# Patient Record
Sex: Female | Born: 1965 | Race: White | Hispanic: No | Smoking: Never smoker
Health system: Southern US, Academic
[De-identification: ages and names within clinical notes are randomized; demographics above are authoritative.]

## PROBLEM LIST (undated history)

## (undated) DIAGNOSIS — D649 Anemia, unspecified: Secondary | ICD-10-CM

## (undated) DIAGNOSIS — R7303 Prediabetes: Secondary | ICD-10-CM

## (undated) DIAGNOSIS — E119 Type 2 diabetes mellitus without complications: Secondary | ICD-10-CM

## (undated) DIAGNOSIS — J45909 Unspecified asthma, uncomplicated: Secondary | ICD-10-CM

## (undated) HISTORY — DX: Unspecified asthma, uncomplicated: J45.909

## (undated) HISTORY — PX: LYMPH NODE BIOPSY: SHX201

## (undated) HISTORY — PX: THIGH LIFT: SHX2496

## (undated) HISTORY — PX: HX HERNIA REPAIR: SHX51

## (undated) HISTORY — DX: Prediabetes: R73.03

## (undated) HISTORY — PX: CYSTECTOMY: SUR359

## (undated) HISTORY — PX: HX COLONOSCOPY: 2100001147

## (undated) HISTORY — DX: Anemia, unspecified: D64.9

## (undated) HISTORY — PX: HX GALL BLADDER SURGERY/CHOLE: SHX55

## (undated) HISTORY — DX: Type 2 diabetes mellitus without complications: E11.9

## (undated) HISTORY — PX: HX TONSILLECTOMY: SHX27

## (undated) HISTORY — PX: GASTRIC BYPASS OPEN: SUR638

## (undated) HISTORY — PX: HX HIP REPLACEMENT: SHX124

## (undated) HISTORY — PX: HX CARPAL TUNNEL RELEASE: SHX101

## (undated) HISTORY — PX: HX BREAST REDUCTION: SHX8

---

## 1995-04-29 ENCOUNTER — Other Ambulatory Visit (HOSPITAL_COMMUNITY): Payer: Self-pay

## 2003-09-02 ENCOUNTER — Ambulatory Visit (HOSPITAL_COMMUNITY): Payer: Self-pay | Admitting: PLASTIC AND RECONSTRUCTIVE SURGERY

## 2021-10-07 ENCOUNTER — Encounter (INDEPENDENT_AMBULATORY_CARE_PROVIDER_SITE_OTHER): Payer: Self-pay

## 2021-10-07 ENCOUNTER — Ambulatory Visit (INDEPENDENT_AMBULATORY_CARE_PROVIDER_SITE_OTHER): Payer: 59 | Admitting: NURSE PRACTITIONER

## 2021-10-07 ENCOUNTER — Encounter (INDEPENDENT_AMBULATORY_CARE_PROVIDER_SITE_OTHER): Payer: Self-pay | Admitting: NURSE PRACTITIONER

## 2021-10-07 ENCOUNTER — Other Ambulatory Visit: Payer: Self-pay

## 2021-10-07 VITALS — BP 141/55 | HR 90 | Temp 97.2°F | Ht 62.0 in | Wt 300.4 lb

## 2021-10-07 DIAGNOSIS — D508 Other iron deficiency anemias: Secondary | ICD-10-CM

## 2021-10-07 DIAGNOSIS — D509 Iron deficiency anemia, unspecified: Secondary | ICD-10-CM

## 2021-10-07 NOTE — H&P (Signed)
Department of Hematology/Oncology  History and Physical    Name: Stephanie Blackburn  WCH:[id number]  Date of Birth: 10-18-1965  Encounter Date: 10/07/2021    REFERRING PROVIDER:  Rowland Lathe D, DO  [address]  [place],  [place] [address]    REASON FOR OFFICE VISIT:  A patient previously sent [facility] for evaluation and management of  iron-deficiency anemia.    HISTORY OF PRESENT ILLNESS:  Stephanie Blackburn is a 56 y.o. female who presents today for iron-deficiency anemia.  She had labs completed by her PCP on 07/14/2021 which showed iron-deficiency.  She was also diagnosed with B12 deficiency and she is been on B12 injections once weekly.  She had Imferon IV but had an allergic reaction to this.  She was seen given Venofer and tolerated this well.  Her last Venofer treatments were 04/2016.  She states that she does crave salt, and has increased fatigue. The patient offers no other complaints at this time.  The patient has had a good appetite and stable weight.  The patient has not noted any new areas of disease on own self exam.  The patient has not had any chest pain or dyspnea.  The patient has not had any headaches or changes in vision.  The patient has not had any abdominal pain, nausea, or vomiting.  There have been no changes in bowel or bladder habits.  The patient has not had any abnormal bleeding or clotting episodes.  There have been no complaints of fever, chills, cough, sputum production, dysuria, or diarrhea.    Assessment/Plan:    1) Iron deficiency anemia, probably secondary to poor absorption, responsive to Venofer.  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   3) Allergy to Imferon  4) B12 deficiency    ROS:   Review of Systems   Constitutional: Positive for fatigue. Negative for appetite change.   HENT:  Negative.    Eyes: Negative.    Respiratory: Negative.  Negative for shortness of breath.    Cardiovascular: Negative.  Negative for chest pain.    Gastrointestinal: Negative.  Negative for abdominal pain, diarrhea, nausea and vomiting.   Genitourinary: Negative.  Negative for difficulty urinating.    Musculoskeletal: Negative.    Skin: Negative.    Neurological: Negative.    Hematological: Negative.    Psychiatric/Behavioral: Negative.         History:  Past Medical History:   Diagnosis Date   . Anemia    . Asthma    . Diabetes mellitus, type 2 (CMS HCC)            Past Surgical History:   Procedure Laterality Date   . CYSTECTOMY      on uterius   . GASTRIC BYPASS OPEN     . HX BREAST REDUCTION     . HX CARPAL TUNNEL RELEASE Bilateral    . HX CHOLECYSTECTOMY     . HX COLONOSCOPY     . HX HERNIA REPAIR     . HX TONSILLECTOMY     . LYMPH NODE BIOPSY     . THIGH LIFT             Social History     Socioeconomic History   . Marital status: Widowed     Spouse name: Not on file   . Number of children: Not on file   . Years of education: Not on file   . Highest education  level: Not on file   Occupational History   . Not on file   Tobacco Use   . Smoking status: Never   . Smokeless tobacco: Never   Vaping Use   . Vaping Use: Never used   Substance and Sexual Activity   . Alcohol use: Never   . Drug use: Never   . Sexual activity: Not on file   Other Topics Concern   . Not on file   Social History Narrative   . Not on file     Social Determinants of Health     Financial Resource Strain: Not on file   Transportation Needs: Not on file   Social Connections: Not on file   Intimate Partner Violence: Not on file   Housing Stability: Not on file       Social History     Social History Narrative   . Not on file       Social History     Substance and Sexual Activity   Drug Use Never       Family Medical History:     Problem Relation (Age of Onset)    No Known Problems Mother, Father            Current Outpatient Medications   Medication Sig   . albuterol sulfate (PROVENTIL OR VENTOLIN OR PROAIR) 90 mcg/actuation Inhalation oral inhaler INHALE 2 PUFFS BY MOUTH 4 TIMES DAILY    . ascorbic acid, vitamin C, (VITAMIN C) 500 mg Oral Tablet Take 1 Tablet (500 mg total) by mouth Once a day   . BD LUER-LOK SYRINGE 3 mL 25 gauge x 1" Syringe USE 1 SYRINGE ONCE A WEEK   . benzonatate (TESSALON) 100 mg Oral Capsule TAKE 1 CAPSULE BY MOUTH THREE TIMES DAILY AS NEEDED   . cyanocobalamin (VITAMIN B12) 1,000 mcg/mL Injection Solution INJECT 1 ML (CC) INTRAMUSCULARLY ONCE A WEEK   . ergocalciferol, vitamin D2, (DRISDOL) 1,250 mcg (50,000 unit) Oral Capsule TAKE 2 CAPSULES A WEEK WITH FOOD AS DIRECTED   . fluticasone propionate (FLONASE) 50 mcg/actuation Nasal Spray, Suspension Administer 2 Sprays into each nostril Once a day   . loratadine (CLARITIN) 10 mg Oral Tablet Take 1 Tablet (10 mg total) by mouth Once a day   . meloxicam (MOBIC) 7.5 mg Oral Tablet Take 1 Tablet (7.5 mg total) by mouth Twice daily   . metFORMIN (GLUCOPHAGE XR) 500 mg Oral Tablet Sustained Release 24 hr Take 1 Tablet (500 mg total) by mouth Once a day   . montelukast (SINGULAIR) 10 mg Oral Tablet Take 1 Tablet (10 mg total) by mouth Once a day   . oxyBUTYnin chloride (DITROPAN XL) 10 mg Oral Tablet Extended Rel 24 hr Take 1 Tablet (10 mg total) by mouth Once a day   . sertraline (ZOLOFT) 100 mg Oral Tablet Take 1 Tablet (100 mg total) by mouth Once a day   . traMADoL (ULTRAM) 50 mg Oral Tablet Take 1 Tablet (50 mg total) by mouth Three times a day as needed   . zinc Sulfate (ZINCATE) 50 mg zinc (220 mg) Oral Tablet Take 1 Tablet (50 mg total) by mouth Three times a day       Allergies   Allergen Reactions   . Ak-Mycin [Erythromycin]    . Penicillins    . Phenergan [Promethazine]          PHYSICAL EXAM:  Most Recent Vitals    Flowsheet Row Office Visit from 10/07/2021 in Hematology/Oncology,  [facility]   Temperature 36.2 C (97.2 F) filed at... 10/07/2021 0948   Heart Rate 90 filed at... 10/07/2021 0948   Respiratory Rate --   BP (Non-Invasive) 141/55 filed at... 10/07/2021 0948   SpO2 --   Height 1.575 m (5'  2") filed at... 10/07/2021 0948   Weight 136 kg (300 lb 6.4 oz) filed at... 10/07/2021 0948   BMI (Calculated) 55.06 filed at... 10/07/2021 0948   BSA (Calculated) 2.44 filed at... 10/07/2021 0948      ECOG Status: (0) Fully active, able to carry on all predisease performance without restriction   Physical Exam  Vitals and nursing note reviewed.   Constitutional:       Appearance: Normal appearance.   HENT:      Head: Normocephalic.      Nose: Nose normal.      Mouth/Throat:      Mouth: Mucous membranes are moist.      Pharynx: Oropharynx is clear.   Eyes:      General: No scleral icterus.     Extraocular Movements: Extraocular movements intact.   Cardiovascular:      Rate and Rhythm: Normal rate and regular rhythm.      Pulses: Normal pulses.      Heart sounds: Normal heart sounds.   Pulmonary:      Effort: Pulmonary effort is normal.      Breath sounds: Normal breath sounds.   Abdominal:      General: Bowel sounds are normal.      Palpations: Abdomen is soft.   Musculoskeletal:         General: Normal range of motion.      Cervical back: Normal range of motion and neck supple.   Skin:     General: Skin is warm and dry.   Neurological:      General: No focal deficit present.      Mental Status: She is alert and oriented to person, place, and time. Mental status is at baseline.   Psychiatric:         Mood and Affect: Mood normal.         Behavior: Behavior normal.         Thought Content: Thought content normal.         Judgment: Judgment normal.         LABS:   Labs completed on 05/07/2019: WBC 12.6, HGB 14.3, HCT 46.3, PLT 229,000.    ASSESSMENT:    ICD-10-CM    1. Iron deficiency anemia  D50.9              PLAN:   1. All relative external and internal medical records were reviewed including available H&Ps, progress notes, procedure notes, imaging's, laboratories, and pathology.   2. All labs from last visit were reviewed with the patient. Details of exam finding's discussed.       KARISHA MARLIN was given the  chance to ask questions, and these were answered to their satisfaction. The patient is welcome to call with any questions or concerns in the meantime.     On the day of the encounter, a total of  52 minutes was spent on this patient encounter including review of historical information, examination, documentation and post-visit activities.   Return in about 12 weeks (around 12/30/2021).     Benjaman Pott, APRN, FNP-BC    CC:  Manuella Ghazi, DO  [address]  [place] [address]    Glasscock,  Kirke Corin, DO  [address]  [place],  [place] [address]      This note was partially generated using MModal Fluency Direct system, and there may be some incorrect words, spellings, and punctuation that were not noted in checking the note before saving.

## 2021-10-19 ENCOUNTER — Other Ambulatory Visit (HOSPITAL_COMMUNITY): Payer: Self-pay | Admitting: NURSE PRACTITIONER

## 2021-10-19 DIAGNOSIS — D509 Iron deficiency anemia, unspecified: Secondary | ICD-10-CM

## 2021-10-19 NOTE — Addendum Note (Signed)
Addended by: Benjaman Pott ANN on: 10/19/2021 12:12 PM     Modules accepted: Orders

## 2021-10-25 ENCOUNTER — Other Ambulatory Visit: Payer: Self-pay

## 2021-10-29 ENCOUNTER — Inpatient Hospital Stay (HOSPITAL_COMMUNITY)
Admission: RE | Admit: 2021-10-29 | Discharge: 2021-10-29 | Disposition: A | Discharge status: Still In Hospital | Payer: 59 | Source: Ambulatory Visit | Attending: NURSE PRACTITIONER | Admitting: NURSE PRACTITIONER

## 2021-10-29 ENCOUNTER — Other Ambulatory Visit: Payer: Self-pay

## 2021-10-29 VITALS — BP 134/70 | HR 84 | Temp 97.9°F | Resp 20

## 2021-10-29 DIAGNOSIS — D508 Other iron deficiency anemias: Secondary | ICD-10-CM

## 2021-10-29 DIAGNOSIS — D509 Iron deficiency anemia, unspecified: Secondary | ICD-10-CM

## 2021-10-29 MED ORDER — SODIUM CHLORIDE 0.9 % IV BOLUS
500.0000 mL | INJECTION | Freq: Once | Status: DC | PRN
Start: 2021-10-29 — End: 2021-10-30

## 2021-10-29 MED ORDER — SODIUM CHLORIDE 0.9 % INTRAVENOUS SOLUTION
200.0000 mg | Freq: Once | INTRAVENOUS | Status: AC
Start: 2021-10-29 — End: 2021-10-29
  Administered 2021-10-29: 0 mg via INTRAVENOUS
  Administered 2021-10-29: 200 mg via INTRAVENOUS
  Filled 2021-10-29: qty 10

## 2021-10-29 MED ORDER — ACETAMINOPHEN 325 MG TABLET
975.0000 mg | ORAL_TABLET | Freq: Once | ORAL | Status: DC | PRN
Start: 2021-10-29 — End: 2021-10-30

## 2021-10-29 MED ORDER — HYDROCORTISONE SOD SUCCINATE 100 MG/2 ML VIAL WRAPPER
100.0000 mg | Freq: Once | INTRAMUSCULAR | Status: DC | PRN
Start: 2021-10-29 — End: 2021-10-30

## 2021-10-29 MED ORDER — LORATADINE 10 MG TABLET
10.0000 mg | ORAL_TABLET | Freq: Once | ORAL | Status: DC | PRN
Start: 2021-10-29 — End: 2021-10-30

## 2021-10-29 MED ORDER — ALBUTEROL SULFATE HFA 90 MCG/ACTUATION AEROSOL INHALER - RN
2.0000 | Freq: Once | RESPIRATORY_TRACT | Status: DC | PRN
Start: 2021-10-29 — End: 2021-10-30

## 2021-10-29 MED ORDER — ALBUTEROL SULFATE 2.5 MG/3 ML (0.083 %) SOLUTION FOR NEBULIZATION
2.5000 mg | INHALATION_SOLUTION | Freq: Once | RESPIRATORY_TRACT | Status: DC | PRN
Start: 2021-10-29 — End: 2021-10-30

## 2021-10-29 MED ORDER — EPINEPHRINE HCL (PF) 1 MG/ML (1 ML) INJECTION SOLUTION
0.3000 mg | Freq: Once | INTRAMUSCULAR | Status: DC | PRN
Start: 2021-10-29 — End: 2021-10-30

## 2021-10-29 MED ORDER — FAMOTIDINE (PF) 20 MG/2 ML INTRAVENOUS SOLUTION
20.0000 mg | Freq: Once | INTRAVENOUS | Status: DC | PRN
Start: 2021-10-29 — End: 2021-10-30

## 2021-10-29 NOTE — Nurses Notes (Signed)
Pt arrived to floor for iron infusion.she is alert an oriented with no complaints voiced.assessment WNL.Pt received iron with no reactions noted.Down via walking when DC,d no concerns voiced upon leaving.Rosaura Carpenter, LPN

## 2021-10-30 ENCOUNTER — Inpatient Hospital Stay
Admission: RE | Admit: 2021-10-30 | Discharge: 2021-10-30 | Disposition: A | Discharge status: Still In Hospital | Payer: 59 | Source: Ambulatory Visit | Attending: NURSE PRACTITIONER | Admitting: NURSE PRACTITIONER

## 2021-10-30 VITALS — BP 143/62 | HR 84 | Temp 97.8°F | Resp 20

## 2021-10-30 DIAGNOSIS — D508 Other iron deficiency anemias: Secondary | ICD-10-CM

## 2021-10-30 MED ORDER — FAMOTIDINE (PF) 20 MG/2 ML INTRAVENOUS SOLUTION
20.0000 mg | Freq: Once | INTRAVENOUS | Status: DC | PRN
Start: 2021-10-30 — End: 2021-10-31

## 2021-10-30 MED ORDER — ACETAMINOPHEN 325 MG TABLET
975.0000 mg | ORAL_TABLET | Freq: Once | ORAL | Status: DC | PRN
Start: 2021-10-30 — End: 2021-10-31

## 2021-10-30 MED ORDER — SODIUM CHLORIDE 0.9 % INTRAVENOUS SOLUTION
200.0000 mg | Freq: Once | INTRAVENOUS | Status: AC
Start: 2021-10-30 — End: 2021-10-30
  Administered 2021-10-30: 200 mg via INTRAVENOUS
  Administered 2021-10-30: 0 mg via INTRAVENOUS
  Filled 2021-10-30: qty 10

## 2021-10-30 MED ORDER — ALBUTEROL SULFATE 2.5 MG/3 ML (0.083 %) SOLUTION FOR NEBULIZATION
2.5000 mg | INHALATION_SOLUTION | Freq: Once | RESPIRATORY_TRACT | Status: DC | PRN
Start: 2021-10-30 — End: 2021-10-31

## 2021-10-30 MED ORDER — HYDROCORTISONE SOD SUCCINATE 100 MG/2 ML VIAL WRAPPER
100.0000 mg | Freq: Once | INTRAMUSCULAR | Status: DC | PRN
Start: 2021-10-30 — End: 2021-10-31

## 2021-10-30 MED ORDER — EPINEPHRINE HCL (PF) 1 MG/ML (1 ML) INJECTION SOLUTION
0.3000 mg | Freq: Once | INTRAMUSCULAR | Status: DC | PRN
Start: 2021-10-30 — End: 2021-10-31

## 2021-10-30 MED ORDER — SODIUM CHLORIDE 0.9 % IV BOLUS
500.0000 mL | INJECTION | Freq: Once | Status: DC | PRN
Start: 2021-10-30 — End: 2021-10-31

## 2021-10-30 MED ORDER — LORATADINE 10 MG TABLET
10.0000 mg | ORAL_TABLET | Freq: Once | ORAL | Status: DC | PRN
Start: 2021-10-30 — End: 2021-10-31

## 2021-10-30 MED ORDER — ALBUTEROL SULFATE HFA 90 MCG/ACTUATION AEROSOL INHALER - RN
2.0000 | Freq: Once | RESPIRATORY_TRACT | Status: DC | PRN
Start: 2021-10-30 — End: 2021-10-31

## 2021-10-30 NOTE — Nurses Notes (Signed)
Pt arrived to floor for iron infusion.She is alert an oriented with no complaints voiced,assessment WNL.iv patent.infusion complete no reactions or distress noted during or after treatment.down via walking when DC,d.Rosaura Carpenter, LPN

## 2021-11-05 ENCOUNTER — Ambulatory Visit (HOSPITAL_COMMUNITY): Payer: Self-pay

## 2021-11-19 ENCOUNTER — Ambulatory Visit (HOSPITAL_COMMUNITY): Payer: Self-pay | Admitting: NURSE PRACTITIONER

## 2021-12-02 ENCOUNTER — Other Ambulatory Visit: Payer: 59 | Attending: FAMILY PRACTICE | Admitting: FAMILY PRACTICE

## 2021-12-02 DIAGNOSIS — N390 Urinary tract infection, site not specified: Secondary | ICD-10-CM | POA: Insufficient documentation

## 2021-12-02 DIAGNOSIS — Z79899 Other long term (current) drug therapy: Secondary | ICD-10-CM | POA: Insufficient documentation

## 2021-12-02 LAB — URINE DRUG SCREEN
AMPHET QL: NEGATIVE
BARB QL: NEGATIVE
BENZO QL: NEGATIVE
BUP QL: NEGATIVE
CANNAQL: NEGATIVE
COCQL: NEGATIVE
METHQL: NEGATIVE
OPIATE: NEGATIVE
OXYCODONE URINE: NEGATIVE
PCP QL: NEGATIVE
TRICYCLIC ANTIDEPRESSANTS URINE: NEGATIVE

## 2021-12-04 LAB — URINE CULTURE: URINE CULTURE: 3000 — AB

## 2021-12-10 ENCOUNTER — Other Ambulatory Visit (HOSPITAL_COMMUNITY): Payer: Self-pay | Admitting: NURSE PRACTITIONER

## 2021-12-10 DIAGNOSIS — D509 Iron deficiency anemia, unspecified: Secondary | ICD-10-CM

## 2021-12-17 ENCOUNTER — Inpatient Hospital Stay (HOSPITAL_COMMUNITY): Payer: 59

## 2021-12-22 ENCOUNTER — Other Ambulatory Visit: Payer: Self-pay

## 2021-12-22 ENCOUNTER — Inpatient Hospital Stay (HOSPITAL_COMMUNITY)
Admission: RE | Admit: 2021-12-22 | Discharge: 2021-12-22 | Disposition: A | Discharge status: Still In Hospital | Payer: 59 | Source: Ambulatory Visit | Attending: NURSE PRACTITIONER | Admitting: NURSE PRACTITIONER

## 2021-12-22 VITALS — BP 155/85 | HR 76 | Temp 98.0°F | Resp 20

## 2021-12-22 DIAGNOSIS — D508 Other iron deficiency anemias: Secondary | ICD-10-CM | POA: Insufficient documentation

## 2021-12-22 MED ORDER — ACETAMINOPHEN 325 MG TABLET
975.0000 mg | ORAL_TABLET | Freq: Once | ORAL | Status: DC | PRN
Start: 2021-12-22 — End: 2021-12-23

## 2021-12-22 MED ORDER — EPINEPHRINE HCL (PF) 1 MG/ML (1 ML) INJECTION SOLUTION
0.3000 mg | Freq: Once | INTRAMUSCULAR | Status: DC | PRN
Start: 2021-12-22 — End: 2021-12-23

## 2021-12-22 MED ORDER — ALBUTEROL SULFATE HFA 90 MCG/ACTUATION AEROSOL INHALER - RN
2.0000 | Freq: Once | RESPIRATORY_TRACT | Status: DC | PRN
Start: 2021-12-22 — End: 2021-12-23

## 2021-12-22 MED ORDER — LORATADINE 10 MG TABLET
10.0000 mg | ORAL_TABLET | Freq: Once | ORAL | Status: DC | PRN
Start: 2021-12-22 — End: 2021-12-23

## 2021-12-22 MED ORDER — HYDROCORTISONE SOD SUCCINATE 100 MG/2 ML VIAL WRAPPER
100.0000 mg | Freq: Once | INTRAMUSCULAR | Status: DC | PRN
Start: 2021-12-22 — End: 2021-12-23

## 2021-12-22 MED ORDER — SODIUM CHLORIDE 0.9 % IV BOLUS
500.0000 mL | INJECTION | Freq: Once | Status: DC | PRN
Start: 2021-12-22 — End: 2021-12-23

## 2021-12-22 MED ORDER — SODIUM CHLORIDE 0.9 % INTRAVENOUS SOLUTION
200.0000 mg | Freq: Once | INTRAVENOUS | Status: AC
Start: 2021-12-22 — End: 2021-12-22
  Administered 2021-12-22: 200 mg via INTRAVENOUS
  Administered 2021-12-22: 0 mg via INTRAVENOUS
  Filled 2021-12-22: qty 10

## 2021-12-22 MED ORDER — ALBUTEROL SULFATE 2.5 MG/3 ML (0.083 %) SOLUTION FOR NEBULIZATION
2.5000 mg | INHALATION_SOLUTION | Freq: Once | RESPIRATORY_TRACT | Status: DC | PRN
Start: 2021-12-22 — End: 2021-12-23

## 2021-12-22 MED ORDER — FAMOTIDINE (PF) 20 MG/2 ML INTRAVENOUS SOLUTION
20.0000 mg | Freq: Once | INTRAVENOUS | Status: DC | PRN
Start: 2021-12-22 — End: 2021-12-23

## 2021-12-22 NOTE — Nurses Notes (Signed)
Summary: pt arrived to floor for iron infusion.she is alert an oriented with no complaints voiced.assessment WNL.iron received with no distress or reactions noted..Down via walking when DC,d no concerns voiced upon leaving.Avriel Kandel, LPN

## 2021-12-23 ENCOUNTER — Ambulatory Visit (INDEPENDENT_AMBULATORY_CARE_PROVIDER_SITE_OTHER): Payer: Self-pay | Admitting: NURSE PRACTITIONER

## 2021-12-23 ENCOUNTER — Ambulatory Visit (HOSPITAL_COMMUNITY): Payer: Self-pay | Admitting: NURSE PRACTITIONER

## 2021-12-24 ENCOUNTER — Inpatient Hospital Stay (HOSPITAL_COMMUNITY)
Admission: RE | Admit: 2021-12-24 | Discharge: 2021-12-24 | Disposition: A | Discharge status: Still In Hospital | Payer: 59 | Source: Ambulatory Visit | Attending: NURSE PRACTITIONER | Admitting: NURSE PRACTITIONER

## 2021-12-24 ENCOUNTER — Other Ambulatory Visit: Payer: Self-pay

## 2021-12-24 VITALS — BP 132/78 | HR 79 | Temp 97.4°F | Resp 20

## 2021-12-24 DIAGNOSIS — D508 Other iron deficiency anemias: Secondary | ICD-10-CM

## 2021-12-24 MED ORDER — ALBUTEROL SULFATE HFA 90 MCG/ACTUATION AEROSOL INHALER - RN
2.0000 | Freq: Once | RESPIRATORY_TRACT | Status: DC | PRN
Start: 2021-12-24 — End: 2021-12-25

## 2021-12-24 MED ORDER — SODIUM CHLORIDE 0.9 % INTRAVENOUS SOLUTION
200.0000 mg | Freq: Once | INTRAVENOUS | Status: AC
Start: 2021-12-24 — End: 2021-12-24
  Administered 2021-12-24: 200 mg via INTRAVENOUS
  Administered 2021-12-24: 0 mg via INTRAVENOUS
  Filled 2021-12-24: qty 10

## 2021-12-24 MED ORDER — EPINEPHRINE HCL (PF) 1 MG/ML (1 ML) INJECTION SOLUTION
0.3000 mg | Freq: Once | INTRAMUSCULAR | Status: DC | PRN
Start: 2021-12-24 — End: 2021-12-25

## 2021-12-24 MED ORDER — LORATADINE 10 MG TABLET
10.0000 mg | ORAL_TABLET | Freq: Once | ORAL | Status: DC | PRN
Start: 2021-12-24 — End: 2021-12-25

## 2021-12-24 MED ORDER — ALBUTEROL SULFATE 2.5 MG/3 ML (0.083 %) SOLUTION FOR NEBULIZATION
2.5000 mg | INHALATION_SOLUTION | Freq: Once | RESPIRATORY_TRACT | Status: DC | PRN
Start: 2021-12-24 — End: 2021-12-25

## 2021-12-24 MED ORDER — FAMOTIDINE (PF) 20 MG/2 ML INTRAVENOUS SOLUTION
20.0000 mg | Freq: Once | INTRAVENOUS | Status: DC | PRN
Start: 2021-12-24 — End: 2021-12-25

## 2021-12-24 MED ORDER — HYDROCORTISONE SOD SUCCINATE 100 MG/2 ML VIAL WRAPPER
100.0000 mg | Freq: Once | INTRAMUSCULAR | Status: DC | PRN
Start: 2021-12-24 — End: 2021-12-25

## 2021-12-24 MED ORDER — SODIUM CHLORIDE 0.9 % IV BOLUS
500.0000 mL | INJECTION | Freq: Once | Status: DC | PRN
Start: 2021-12-24 — End: 2021-12-25

## 2021-12-24 MED ORDER — ACETAMINOPHEN 325 MG TABLET
975.0000 mg | ORAL_TABLET | Freq: Once | ORAL | Status: DC | PRN
Start: 2021-12-24 — End: 2021-12-25

## 2021-12-24 NOTE — Nurses Notes (Signed)
[id number] Patient came in ambulatory with daughter for venofer today. Iv started. venofer 200 mg given iv. Tolerated well. No signs or symptoms of reactions noted. Iv dc'd. Left unit ambulatory with no c/o offered. Lovenia Kim, RN

## 2021-12-29 ENCOUNTER — Inpatient Hospital Stay (HOSPITAL_COMMUNITY): Payer: 59

## 2021-12-31 ENCOUNTER — Inpatient Hospital Stay (HOSPITAL_COMMUNITY): Payer: 59

## 2022-01-05 ENCOUNTER — Inpatient Hospital Stay (HOSPITAL_COMMUNITY)
Admission: RE | Admit: 2022-01-05 | Discharge: 2022-01-05 | Disposition: A | Discharge status: Still In Hospital | Payer: 59 | Source: Ambulatory Visit | Attending: NURSE PRACTITIONER | Admitting: NURSE PRACTITIONER

## 2022-01-05 ENCOUNTER — Other Ambulatory Visit: Payer: Self-pay

## 2022-01-05 VITALS — BP 123/89 | HR 77 | Temp 97.9°F | Resp 18

## 2022-01-05 DIAGNOSIS — D508 Other iron deficiency anemias: Secondary | ICD-10-CM

## 2022-01-05 MED ORDER — SODIUM CHLORIDE 0.9 % IV BOLUS
500.0000 mL | INJECTION | Freq: Once | Status: DC | PRN
Start: 2022-01-05 — End: 2022-01-06

## 2022-01-05 MED ORDER — HYDROCORTISONE SOD SUCCINATE 100 MG/2 ML VIAL WRAPPER
100.0000 mg | Freq: Once | INTRAMUSCULAR | Status: DC | PRN
Start: 2022-01-05 — End: 2022-01-06

## 2022-01-05 MED ORDER — ACETAMINOPHEN 325 MG TABLET
975.0000 mg | ORAL_TABLET | Freq: Once | ORAL | Status: DC | PRN
Start: 2022-01-05 — End: 2022-01-06

## 2022-01-05 MED ORDER — ALBUTEROL SULFATE HFA 90 MCG/ACTUATION AEROSOL INHALER - RN
2.0000 | Freq: Once | RESPIRATORY_TRACT | Status: DC | PRN
Start: 2022-01-05 — End: 2022-01-06

## 2022-01-05 MED ORDER — SODIUM CHLORIDE 0.9 % INTRAVENOUS SOLUTION
200.0000 mg | Freq: Once | INTRAVENOUS | Status: AC
Start: 2022-01-05 — End: 2022-01-05
  Administered 2022-01-05: 0 mg via INTRAVENOUS
  Administered 2022-01-05: 200 mg via INTRAVENOUS
  Filled 2022-01-05: qty 10

## 2022-01-05 MED ORDER — LORATADINE 10 MG TABLET
10.0000 mg | ORAL_TABLET | Freq: Once | ORAL | Status: DC | PRN
Start: 2022-01-05 — End: 2022-01-06

## 2022-01-05 MED ORDER — EPINEPHRINE HCL (PF) 1 MG/ML (1 ML) INJECTION SOLUTION
0.3000 mg | Freq: Once | INTRAMUSCULAR | Status: DC | PRN
Start: 2022-01-05 — End: 2022-01-06

## 2022-01-05 MED ORDER — FAMOTIDINE (PF) 20 MG/2 ML INTRAVENOUS SOLUTION
20.0000 mg | Freq: Once | INTRAVENOUS | Status: DC | PRN
Start: 2022-01-05 — End: 2022-01-06

## 2022-01-05 MED ORDER — ALBUTEROL SULFATE 2.5 MG/3 ML (0.083 %) SOLUTION FOR NEBULIZATION
2.5000 mg | INHALATION_SOLUTION | Freq: Once | RESPIRATORY_TRACT | Status: DC | PRN
Start: 2022-01-05 — End: 2022-01-06

## 2022-01-05 NOTE — Nurses Notes (Signed)
1305 Patient arrived to unit ambulatory accompanied by family member. She is here for venofer infusion. All orders have been released. Tonna Boehringer, RN  [phone] IV placed and VSS. Tonna Boehringer, RN  1332 Venofer infusion started. Tonna Boehringer, RN  [phone] Venofer infusion complete. Patient tolerated infusion well with no infusion reaction suspected. Tonna Boehringer, RN  1435 IV removed and pressure dressing applied. VSS. Patient left unit ambulatory. Tonna Boehringer, RN

## 2022-01-07 ENCOUNTER — Inpatient Hospital Stay (HOSPITAL_COMMUNITY)
Admission: RE | Admit: 2022-01-07 | Discharge: 2022-01-07 | Disposition: A | Discharge status: Still In Hospital | Payer: 59 | Source: Ambulatory Visit | Attending: NURSE PRACTITIONER | Admitting: NURSE PRACTITIONER

## 2022-01-07 ENCOUNTER — Other Ambulatory Visit: Payer: Self-pay

## 2022-01-07 VITALS — BP 135/62 | HR 74 | Temp 97.9°F | Resp 20

## 2022-01-07 DIAGNOSIS — D508 Other iron deficiency anemias: Secondary | ICD-10-CM

## 2022-01-07 MED ORDER — ALBUTEROL SULFATE 2.5 MG/3 ML (0.083 %) SOLUTION FOR NEBULIZATION
2.5000 mg | INHALATION_SOLUTION | Freq: Once | RESPIRATORY_TRACT | Status: DC | PRN
Start: 2022-01-07 — End: 2022-01-08

## 2022-01-07 MED ORDER — ALBUTEROL SULFATE HFA 90 MCG/ACTUATION AEROSOL INHALER - RN
2.0000 | Freq: Once | RESPIRATORY_TRACT | Status: DC | PRN
Start: 2022-01-07 — End: 2022-01-08

## 2022-01-07 MED ORDER — HYDROCORTISONE SOD SUCCINATE 100 MG/2 ML VIAL WRAPPER
100.0000 mg | Freq: Once | INTRAMUSCULAR | Status: DC | PRN
Start: 2022-01-07 — End: 2022-01-08

## 2022-01-07 MED ORDER — LORATADINE 10 MG TABLET
10.0000 mg | ORAL_TABLET | Freq: Once | ORAL | Status: DC | PRN
Start: 2022-01-07 — End: 2022-01-08

## 2022-01-07 MED ORDER — EPINEPHRINE HCL (PF) 1 MG/ML (1 ML) INJECTION SOLUTION
0.3000 mg | Freq: Once | INTRAMUSCULAR | Status: DC | PRN
Start: 2022-01-07 — End: 2022-01-08

## 2022-01-07 MED ORDER — SODIUM CHLORIDE 0.9 % IV BOLUS
500.0000 mL | INJECTION | Freq: Once | Status: DC | PRN
Start: 2022-01-07 — End: 2022-01-08

## 2022-01-07 MED ORDER — SODIUM CHLORIDE 0.9 % INTRAVENOUS SOLUTION
200.0000 mg | Freq: Once | INTRAVENOUS | Status: AC
Start: 2022-01-07 — End: 2022-01-07
  Administered 2022-01-07: 200 mg via INTRAVENOUS
  Administered 2022-01-07: 0 mg via INTRAVENOUS
  Filled 2022-01-07: qty 10

## 2022-01-07 MED ORDER — FAMOTIDINE (PF) 20 MG/2 ML INTRAVENOUS SOLUTION
20.0000 mg | Freq: Once | INTRAVENOUS | Status: DC | PRN
Start: 2022-01-07 — End: 2022-01-08

## 2022-01-07 MED ORDER — ACETAMINOPHEN 325 MG TABLET
975.0000 mg | ORAL_TABLET | Freq: Once | ORAL | Status: DC | PRN
Start: 2022-01-07 — End: 2022-01-08

## 2022-01-07 NOTE — Nurses Notes (Signed)
Summary: pt arrived to floor for iron infusion.she is alert an oriented with no complaints voiced.assessment WNL.iron received with no distress or reactions noted..Down via walking when DC,d no concerns voiced upon leaving.Rosaura Carpenter, LPN

## 2022-01-11 ENCOUNTER — Other Ambulatory Visit: Payer: Self-pay

## 2022-01-11 ENCOUNTER — Inpatient Hospital Stay
Admission: RE | Admit: 2022-01-11 | Discharge: 2022-01-11 | Disposition: A | Discharge status: Still In Hospital | Payer: 59 | Source: Ambulatory Visit | Attending: NURSE PRACTITIONER | Admitting: NURSE PRACTITIONER

## 2022-01-11 VITALS — BP 136/75 | HR 83 | Temp 97.6°F | Resp 20

## 2022-01-11 DIAGNOSIS — D508 Other iron deficiency anemias: Secondary | ICD-10-CM

## 2022-01-11 MED ORDER — ACETAMINOPHEN 325 MG TABLET
975.0000 mg | ORAL_TABLET | Freq: Once | ORAL | Status: DC | PRN
Start: 2022-01-11 — End: 2022-01-12

## 2022-01-11 MED ORDER — FAMOTIDINE (PF) 20 MG/2 ML INTRAVENOUS SOLUTION
20.0000 mg | Freq: Once | INTRAVENOUS | Status: DC | PRN
Start: 2022-01-11 — End: 2022-01-12

## 2022-01-11 MED ORDER — ALBUTEROL SULFATE 2.5 MG/3 ML (0.083 %) SOLUTION FOR NEBULIZATION
2.5000 mg | INHALATION_SOLUTION | Freq: Once | RESPIRATORY_TRACT | Status: DC | PRN
Start: 2022-01-11 — End: 2022-01-12

## 2022-01-11 MED ORDER — SODIUM CHLORIDE 0.9 % INTRAVENOUS SOLUTION
200.0000 mg | Freq: Once | INTRAVENOUS | Status: AC
Start: 2022-01-11 — End: 2022-01-11
  Administered 2022-01-11: 0 mg via INTRAVENOUS
  Administered 2022-01-11: 200 mg via INTRAVENOUS
  Filled 2022-01-11: qty 10

## 2022-01-11 MED ORDER — LORATADINE 10 MG TABLET
10.0000 mg | ORAL_TABLET | Freq: Once | ORAL | Status: DC | PRN
Start: 2022-01-11 — End: 2022-01-12

## 2022-01-11 MED ORDER — HYDROCORTISONE SOD SUCCINATE 100 MG/2 ML VIAL WRAPPER
100.0000 mg | Freq: Once | INTRAMUSCULAR | Status: DC | PRN
Start: 2022-01-11 — End: 2022-01-12

## 2022-01-11 MED ORDER — SODIUM CHLORIDE 0.9 % IV BOLUS
500.0000 mL | INJECTION | Freq: Once | Status: DC | PRN
Start: 2022-01-11 — End: 2022-01-12

## 2022-01-11 MED ORDER — ALBUTEROL SULFATE HFA 90 MCG/ACTUATION AEROSOL INHALER - RN
2.0000 | Freq: Once | RESPIRATORY_TRACT | Status: DC | PRN
Start: 2022-01-11 — End: 2022-01-12

## 2022-01-11 MED ORDER — EPINEPHRINE HCL (PF) 1 MG/ML (1 ML) INJECTION SOLUTION
0.3000 mg | Freq: Once | INTRAMUSCULAR | Status: DC | PRN
Start: 2022-01-11 — End: 2022-01-12

## 2022-01-11 NOTE — Nurses Notes (Signed)
Summary: pt arrived to floor for iron infusion.she is alert an oriented with no complaints voiced.assessment WNL.iron received with no distress or reactions noted..Down via walking when DC,d no concerns voiced upon leaving.Kiyara Bouffard, LPN

## 2022-01-12 ENCOUNTER — Inpatient Hospital Stay (HOSPITAL_COMMUNITY): Payer: 59

## 2022-01-14 ENCOUNTER — Inpatient Hospital Stay (HOSPITAL_COMMUNITY)
Admission: RE | Admit: 2022-01-14 | Discharge: 2022-01-14 | Disposition: A | Discharge status: Still In Hospital | Payer: 59 | Source: Ambulatory Visit | Attending: NURSE PRACTITIONER | Admitting: NURSE PRACTITIONER

## 2022-01-14 ENCOUNTER — Other Ambulatory Visit: Payer: Self-pay

## 2022-01-14 VITALS — BP 151/75 | HR 82 | Temp 97.9°F | Resp 20

## 2022-01-14 DIAGNOSIS — D508 Other iron deficiency anemias: Secondary | ICD-10-CM

## 2022-01-14 MED ORDER — ALBUTEROL SULFATE 2.5 MG/3 ML (0.083 %) SOLUTION FOR NEBULIZATION
2.5000 mg | INHALATION_SOLUTION | Freq: Once | RESPIRATORY_TRACT | Status: DC | PRN
Start: 2022-01-14 — End: 2022-01-15

## 2022-01-14 MED ORDER — ALBUTEROL SULFATE HFA 90 MCG/ACTUATION AEROSOL INHALER - RN
2.0000 | Freq: Once | RESPIRATORY_TRACT | Status: DC | PRN
Start: 2022-01-14 — End: 2022-01-15

## 2022-01-14 MED ORDER — EPINEPHRINE HCL (PF) 1 MG/ML (1 ML) INJECTION SOLUTION
0.3000 mg | Freq: Once | INTRAMUSCULAR | Status: DC | PRN
Start: 2022-01-14 — End: 2022-01-15

## 2022-01-14 MED ORDER — LORATADINE 10 MG TABLET
10.0000 mg | ORAL_TABLET | Freq: Once | ORAL | Status: DC | PRN
Start: 2022-01-14 — End: 2022-01-15

## 2022-01-14 MED ORDER — SODIUM CHLORIDE 0.9 % IV BOLUS
500.0000 mL | INJECTION | Freq: Once | Status: DC | PRN
Start: 2022-01-14 — End: 2022-01-15

## 2022-01-14 MED ORDER — ACETAMINOPHEN 325 MG TABLET
975.0000 mg | ORAL_TABLET | Freq: Once | ORAL | Status: DC | PRN
Start: 2022-01-14 — End: 2022-01-15

## 2022-01-14 MED ORDER — FAMOTIDINE (PF) 20 MG/2 ML INTRAVENOUS SOLUTION
20.0000 mg | Freq: Once | INTRAVENOUS | Status: DC | PRN
Start: 2022-01-14 — End: 2022-01-15

## 2022-01-14 MED ORDER — SODIUM CHLORIDE 0.9 % INTRAVENOUS SOLUTION
200.0000 mg | Freq: Once | INTRAVENOUS | Status: AC
Start: 2022-01-14 — End: 2022-01-14
  Administered 2022-01-14: 0 mg via INTRAVENOUS
  Administered 2022-01-14: 200 mg via INTRAVENOUS
  Filled 2022-01-14: qty 10

## 2022-01-14 MED ORDER — HYDROCORTISONE SOD SUCCINATE 100 MG/2 ML VIAL WRAPPER
100.0000 mg | Freq: Once | INTRAMUSCULAR | Status: DC | PRN
Start: 2022-01-14 — End: 2022-01-15

## 2022-01-14 NOTE — Nurses Notes (Signed)
Summary: pt arrived to floor for iron infusion.she is alert an oriented with no complaints voiced.assessment WNL.iron received with no distress or reactions noted..Down via walking when DC,d no concerns voiced upon leaving.Christine Morton, LPN

## 2022-01-19 ENCOUNTER — Other Ambulatory Visit: Payer: Self-pay

## 2022-01-19 ENCOUNTER — Inpatient Hospital Stay (HOSPITAL_COMMUNITY)
Admission: RE | Admit: 2022-01-19 | Discharge: 2022-01-19 | Disposition: A | Discharge status: Still In Hospital | Payer: 59 | Source: Ambulatory Visit | Attending: NURSE PRACTITIONER | Admitting: NURSE PRACTITIONER

## 2022-01-19 VITALS — BP 143/69 | HR 90 | Temp 97.7°F | Resp 20

## 2022-01-19 DIAGNOSIS — D508 Other iron deficiency anemias: Secondary | ICD-10-CM

## 2022-01-19 MED ORDER — ACETAMINOPHEN 325 MG TABLET
975.0000 mg | ORAL_TABLET | Freq: Once | ORAL | Status: DC | PRN
Start: 2022-01-19 — End: 2022-01-20

## 2022-01-19 MED ORDER — HYDROCORTISONE SOD SUCCINATE 100 MG/2 ML VIAL WRAPPER
100.0000 mg | Freq: Once | INTRAMUSCULAR | Status: DC | PRN
Start: 2022-01-19 — End: 2022-01-20

## 2022-01-19 MED ORDER — ALBUTEROL SULFATE 2.5 MG/3 ML (0.083 %) SOLUTION FOR NEBULIZATION
2.5000 mg | INHALATION_SOLUTION | Freq: Once | RESPIRATORY_TRACT | Status: DC | PRN
Start: 2022-01-19 — End: 2022-01-20

## 2022-01-19 MED ORDER — LORATADINE 10 MG TABLET
10.0000 mg | ORAL_TABLET | Freq: Once | ORAL | Status: DC | PRN
Start: 2022-01-19 — End: 2022-01-20

## 2022-01-19 MED ORDER — FAMOTIDINE (PF) 20 MG/2 ML INTRAVENOUS SOLUTION
20.0000 mg | Freq: Once | INTRAVENOUS | Status: DC | PRN
Start: 2022-01-19 — End: 2022-01-20

## 2022-01-19 MED ORDER — EPINEPHRINE HCL (PF) 1 MG/ML (1 ML) INJECTION SOLUTION
0.3000 mg | Freq: Once | INTRAMUSCULAR | Status: DC | PRN
Start: 2022-01-19 — End: 2022-01-20

## 2022-01-19 MED ORDER — SODIUM CHLORIDE 0.9 % IV BOLUS
500.0000 mL | INJECTION | Freq: Once | Status: DC | PRN
Start: 2022-01-19 — End: 2022-01-20

## 2022-01-19 MED ORDER — ALBUTEROL SULFATE HFA 90 MCG/ACTUATION AEROSOL INHALER - RN
2.0000 | Freq: Once | RESPIRATORY_TRACT | Status: DC | PRN
Start: 2022-01-19 — End: 2022-01-20

## 2022-01-19 MED ORDER — SODIUM CHLORIDE 0.9 % INTRAVENOUS SOLUTION
200.0000 mg | Freq: Once | INTRAVENOUS | Status: AC
Start: 2022-01-19 — End: 2022-01-19
  Administered 2022-01-19: 200 mg via INTRAVENOUS
  Administered 2022-01-19: 0 mg via INTRAVENOUS
  Filled 2022-01-19: qty 10

## 2022-01-19 NOTE — Nurses Notes (Signed)
Summary: pt arrived for/ an received iv iron infusion with no reactions or distress noted.assessment WNL.treatment complete down via walking at DC no concerns voiced upon leaving floor.Rosaura Carpenter, LPN

## 2022-01-21 ENCOUNTER — Inpatient Hospital Stay (HOSPITAL_COMMUNITY)
Admission: RE | Admit: 2022-01-21 | Discharge: 2022-01-21 | Disposition: A | Discharge status: Still In Hospital | Payer: 59 | Source: Ambulatory Visit | Attending: NURSE PRACTITIONER | Admitting: NURSE PRACTITIONER

## 2022-01-21 ENCOUNTER — Other Ambulatory Visit: Payer: Self-pay

## 2022-01-21 VITALS — BP 136/74 | HR 72 | Temp 97.9°F | Resp 20

## 2022-01-21 DIAGNOSIS — D508 Other iron deficiency anemias: Secondary | ICD-10-CM

## 2022-01-21 MED ORDER — ALBUTEROL SULFATE HFA 90 MCG/ACTUATION AEROSOL INHALER - RN
2.0000 | Freq: Once | RESPIRATORY_TRACT | Status: DC | PRN
Start: 2022-01-21 — End: 2022-01-22

## 2022-01-21 MED ORDER — EPINEPHRINE HCL (PF) 1 MG/ML (1 ML) INJECTION SOLUTION
0.3000 mg | Freq: Once | INTRAMUSCULAR | Status: DC | PRN
Start: 2022-01-21 — End: 2022-01-22

## 2022-01-21 MED ORDER — SODIUM CHLORIDE 0.9 % INTRAVENOUS SOLUTION
200.0000 mg | Freq: Once | INTRAVENOUS | Status: AC
Start: 2022-01-21 — End: 2022-01-21
  Administered 2022-01-21: 200 mg via INTRAVENOUS
  Administered 2022-01-21: 0 mg via INTRAVENOUS
  Filled 2022-01-21: qty 10

## 2022-01-21 MED ORDER — LORATADINE 10 MG TABLET
10.0000 mg | ORAL_TABLET | Freq: Once | ORAL | Status: DC | PRN
Start: 2022-01-21 — End: 2022-01-22

## 2022-01-21 MED ORDER — HYDROCORTISONE SOD SUCCINATE 100 MG/2 ML VIAL WRAPPER
100.0000 mg | Freq: Once | INTRAMUSCULAR | Status: DC | PRN
Start: 2022-01-21 — End: 2022-01-22

## 2022-01-21 MED ORDER — SODIUM CHLORIDE 0.9 % IV BOLUS
500.0000 mL | INJECTION | Freq: Once | Status: DC | PRN
Start: 2022-01-21 — End: 2022-01-22

## 2022-01-21 MED ORDER — FAMOTIDINE (PF) 20 MG/2 ML INTRAVENOUS SOLUTION
20.0000 mg | Freq: Once | INTRAVENOUS | Status: DC | PRN
Start: 2022-01-21 — End: 2022-01-22

## 2022-01-21 MED ORDER — ACETAMINOPHEN 325 MG TABLET
975.0000 mg | ORAL_TABLET | Freq: Once | ORAL | Status: DC | PRN
Start: 2022-01-21 — End: 2022-01-22

## 2022-01-21 MED ORDER — ALBUTEROL SULFATE 2.5 MG/3 ML (0.083 %) SOLUTION FOR NEBULIZATION
2.5000 mg | INHALATION_SOLUTION | Freq: Once | RESPIRATORY_TRACT | Status: DC | PRN
Start: 2022-01-21 — End: 2022-01-22

## 2022-01-21 NOTE — Nurses Notes (Signed)
Summary: pt arrived for/ an received iv iron infusion with no reactions or distress noted.assessment WNL.treatment complete down via walking at DC no concerns voiced upon leaving floor.Passion Lavin, LPN

## 2022-01-26 ENCOUNTER — Inpatient Hospital Stay (HOSPITAL_COMMUNITY): Payer: 59

## 2022-01-28 ENCOUNTER — Other Ambulatory Visit: Payer: Self-pay

## 2022-01-28 ENCOUNTER — Inpatient Hospital Stay (HOSPITAL_COMMUNITY)
Admission: RE | Admit: 2022-01-28 | Discharge: 2022-01-28 | Disposition: A | Discharge status: Still In Hospital | Payer: 59 | Source: Ambulatory Visit | Attending: NURSE PRACTITIONER | Admitting: NURSE PRACTITIONER

## 2022-01-28 VITALS — BP 125/73 | HR 72 | Temp 97.2°F | Resp 20

## 2022-01-28 DIAGNOSIS — D508 Other iron deficiency anemias: Secondary | ICD-10-CM

## 2022-01-28 MED ORDER — HYDROCORTISONE SOD SUCCINATE 100 MG/2 ML VIAL WRAPPER
100.0000 mg | Freq: Once | INTRAMUSCULAR | Status: DC | PRN
Start: 2022-01-28 — End: 2022-01-29

## 2022-01-28 MED ORDER — SODIUM CHLORIDE 0.9 % IV BOLUS
500.0000 mL | INJECTION | Freq: Once | Status: DC | PRN
Start: 2022-01-28 — End: 2022-01-29

## 2022-01-28 MED ORDER — ACETAMINOPHEN 325 MG TABLET
975.0000 mg | ORAL_TABLET | Freq: Once | ORAL | Status: DC | PRN
Start: 2022-01-28 — End: 2022-01-29

## 2022-01-28 MED ORDER — ALBUTEROL SULFATE 2.5 MG/3 ML (0.083 %) SOLUTION FOR NEBULIZATION
2.5000 mg | INHALATION_SOLUTION | Freq: Once | RESPIRATORY_TRACT | Status: DC | PRN
Start: 2022-01-28 — End: 2022-01-29

## 2022-01-28 MED ORDER — LORATADINE 10 MG TABLET
10.0000 mg | ORAL_TABLET | Freq: Once | ORAL | Status: DC | PRN
Start: 2022-01-28 — End: 2022-01-29

## 2022-01-28 MED ORDER — SODIUM CHLORIDE 0.9 % INTRAVENOUS SOLUTION
200.0000 mg | Freq: Once | INTRAVENOUS | Status: AC
Start: 2022-01-28 — End: 2022-01-28
  Administered 2022-01-28: 0 mg via INTRAVENOUS
  Administered 2022-01-28: 200 mg via INTRAVENOUS
  Filled 2022-01-28: qty 10

## 2022-01-28 MED ORDER — FAMOTIDINE (PF) 20 MG/2 ML INTRAVENOUS SOLUTION
20.0000 mg | Freq: Once | INTRAVENOUS | Status: DC | PRN
Start: 2022-01-28 — End: 2022-01-29

## 2022-01-28 MED ORDER — EPINEPHRINE HCL (PF) 1 MG/ML (1 ML) INJECTION SOLUTION
0.3000 mg | Freq: Once | INTRAMUSCULAR | Status: DC | PRN
Start: 2022-01-28 — End: 2022-01-29

## 2022-01-28 MED ORDER — ALBUTEROL SULFATE HFA 90 MCG/ACTUATION AEROSOL INHALER - RN
2.0000 | Freq: Once | RESPIRATORY_TRACT | Status: DC | PRN
Start: 2022-01-28 — End: 2022-01-29

## 2022-01-28 NOTE — Nurses Notes (Signed)
1310: Patient and daughter arrived ambulatory onto unit. VSS. Assessments WNL. No complaints voiced.  1345: IV access established in Left hand.  [id number]: 200mg  Venofer infused over 30 minutes per pharmacy.  1430: Infusion and flush complete. VSS. IV removed with catheter intact.   Patient and daughter left unit ambulatory in no new reported distress.  , RN

## 2022-01-29 ENCOUNTER — Inpatient Hospital Stay
Admission: RE | Admit: 2022-01-29 | Discharge: 2022-01-29 | Disposition: A | Discharge status: Still In Hospital | Payer: 59 | Source: Ambulatory Visit | Attending: NURSE PRACTITIONER | Admitting: NURSE PRACTITIONER

## 2022-01-29 ENCOUNTER — Encounter (HOSPITAL_COMMUNITY): Payer: Self-pay | Admitting: NURSE PRACTITIONER

## 2022-01-29 VITALS — BP 111/70 | HR 72 | Temp 97.9°F | Resp 20

## 2022-01-29 DIAGNOSIS — D508 Other iron deficiency anemias: Secondary | ICD-10-CM

## 2022-01-29 MED ORDER — ALBUTEROL SULFATE HFA 90 MCG/ACTUATION AEROSOL INHALER - RN
2.0000 | Freq: Once | RESPIRATORY_TRACT | Status: DC | PRN
Start: 2022-01-29 — End: 2022-01-30

## 2022-01-29 MED ORDER — HYDROCORTISONE SOD SUCCINATE 100 MG/2 ML VIAL WRAPPER
100.0000 mg | Freq: Once | INTRAMUSCULAR | Status: DC | PRN
Start: 2022-01-29 — End: 2022-01-30

## 2022-01-29 MED ORDER — EPINEPHRINE HCL (PF) 1 MG/ML (1 ML) INJECTION SOLUTION
0.3000 mg | Freq: Once | INTRAMUSCULAR | Status: DC | PRN
Start: 2022-01-29 — End: 2022-01-30

## 2022-01-29 MED ORDER — LORATADINE 10 MG TABLET
10.0000 mg | ORAL_TABLET | Freq: Once | ORAL | Status: DC | PRN
Start: 2022-01-29 — End: 2022-01-30

## 2022-01-29 MED ORDER — FAMOTIDINE (PF) 20 MG/2 ML INTRAVENOUS SOLUTION
20.0000 mg | Freq: Once | INTRAVENOUS | Status: DC | PRN
Start: 2022-01-29 — End: 2022-01-30

## 2022-01-29 MED ORDER — SODIUM CHLORIDE 0.9 % INTRAVENOUS SOLUTION
200.0000 mg | Freq: Once | INTRAVENOUS | Status: AC
Start: 2022-01-29 — End: 2022-01-29
  Administered 2022-01-29: 0 mg via INTRAVENOUS
  Administered 2022-01-29: 200 mg via INTRAVENOUS
  Filled 2022-01-29: qty 10

## 2022-01-29 MED ORDER — ALBUTEROL SULFATE 2.5 MG/3 ML (0.083 %) SOLUTION FOR NEBULIZATION
2.5000 mg | INHALATION_SOLUTION | Freq: Once | RESPIRATORY_TRACT | Status: DC | PRN
Start: 2022-01-29 — End: 2022-01-30

## 2022-01-29 MED ORDER — SODIUM CHLORIDE 0.9 % IV BOLUS
500.0000 mL | INJECTION | Freq: Once | Status: DC | PRN
Start: 2022-01-29 — End: 2022-01-30

## 2022-01-29 MED ORDER — ACETAMINOPHEN 325 MG TABLET
975.0000 mg | ORAL_TABLET | Freq: Once | ORAL | Status: DC | PRN
Start: 2022-01-29 — End: 2022-01-30

## 2022-01-29 NOTE — Nurses Notes (Signed)
1258 Pt arrived to unit ambulatory and in no distress.  Pt to receive venafer today.  dtr with pt/  1345 IV inserted without difficulty   1402 Venafer started  1440 Venafer completed.  Ns flush started  1500 flush completed and iv was d/c'd.  vss  1509 Pt left unit ambulatory with her daughter and in no distress for discharge home

## 2022-02-02 ENCOUNTER — Ambulatory Visit (HOSPITAL_COMMUNITY): Payer: Self-pay | Admitting: NURSE PRACTITIONER

## 2022-02-19 ENCOUNTER — Other Ambulatory Visit (HOSPITAL_COMMUNITY): Payer: Self-pay | Admitting: FAMILY PRACTICE

## 2022-02-19 ENCOUNTER — Other Ambulatory Visit: Payer: Self-pay

## 2022-02-19 ENCOUNTER — Other Ambulatory Visit: Payer: 59 | Attending: FAMILY PRACTICE

## 2022-02-19 DIAGNOSIS — R7303 Prediabetes: Secondary | ICD-10-CM | POA: Insufficient documentation

## 2022-02-19 DIAGNOSIS — E559 Vitamin D deficiency, unspecified: Secondary | ICD-10-CM | POA: Insufficient documentation

## 2022-02-19 DIAGNOSIS — E538 Deficiency of other specified B group vitamins: Secondary | ICD-10-CM | POA: Insufficient documentation

## 2022-02-19 DIAGNOSIS — R5383 Other fatigue: Secondary | ICD-10-CM | POA: Insufficient documentation

## 2022-02-19 DIAGNOSIS — Z1231 Encounter for screening mammogram for malignant neoplasm of breast: Secondary | ICD-10-CM

## 2022-02-19 DIAGNOSIS — D509 Iron deficiency anemia, unspecified: Secondary | ICD-10-CM | POA: Insufficient documentation

## 2022-02-19 DIAGNOSIS — M25551 Pain in right hip: Secondary | ICD-10-CM | POA: Insufficient documentation

## 2022-02-19 DIAGNOSIS — G8929 Other chronic pain: Secondary | ICD-10-CM | POA: Insufficient documentation

## 2022-02-19 LAB — CBC WITH DIFF
BASOPHIL #: 0.1 10*3/uL (ref 0.00–0.30)
BASOPHIL %: 1 % (ref 0–3)
EOSINOPHIL #: 0.3 10*3/uL (ref 0.00–0.80)
EOSINOPHIL %: 5 % (ref 0–7)
HCT: 46.3 % (ref 37.0–47.0)
HGB: 14.8 g/dL (ref 12.5–16.0)
LYMPHOCYTE #: 1.5 10*3/uL (ref 1.10–5.00)
LYMPHOCYTE %: 22 % — ABNORMAL LOW (ref 25–45)
MCH: 24.7 pg — ABNORMAL LOW (ref 27.0–32.0)
MCHC: 32 g/dL (ref 32.0–36.0)
MCV: 77 fL — ABNORMAL LOW (ref 78.0–99.0)
MONOCYTE #: 0.7 10*3/uL (ref 0.00–1.30)
MONOCYTE %: 11 % (ref 0–12)
MPV: 9.4 fL (ref 7.4–10.4)
NEUTROPHIL #: 4.1 10*3/uL (ref 1.80–8.40)
NEUTROPHIL %: 62 % (ref 40–76)
PLATELET COMMENT: ADEQUATE
PLATELETS: 190 10*3/uL (ref 140–440)
RBC: 6.01 10*6/uL — ABNORMAL HIGH (ref 4.20–5.40)
RDW: 25.6 % — ABNORMAL HIGH (ref 11.6–14.8)
WBC: 6.7 10*3/uL (ref 4.0–10.5)
WBCS UNCORRECTED: 6.7 10*3/uL

## 2022-02-19 LAB — HGA1C (HEMOGLOBIN A1C WITH EST AVG GLUCOSE): HEMOGLOBIN A1C: 5.9 % (ref 4.0–6.0)

## 2022-02-19 LAB — BASIC METABOLIC PANEL
ANION GAP: 6 mmol/L — ABNORMAL LOW (ref 10–20)
BUN/CREA RATIO: 22 (ref 6–22)
BUN: 18 mg/dL (ref 7–25)
CALCIUM: 9.6 mg/dL (ref 8.6–10.3)
CHLORIDE: 105 mmol/L (ref 98–107)
CO2 TOTAL: 31 mmol/L (ref 21–31)
CREATININE: 0.81 mg/dL (ref 0.60–1.30)
ESTIMATED GFR: 85 mL/min/{1.73_m2} (ref >59)
GLUCOSE: 116 mg/dL — ABNORMAL HIGH (ref 74–109)
OSMOLALITY, CALCULATED: 286 mOsm/kg (ref 270–290)
POTASSIUM: 4.3 mmol/L (ref 3.5–5.1)
SODIUM: 142 mmol/L (ref 136–145)

## 2022-02-19 LAB — HEPATIC FUNCTION PANEL
ALBUMIN/GLOBULIN RATIO: 1.5 — ABNORMAL HIGH (ref 0.8–1.4)
ALBUMIN: 4.4 g/dL (ref 3.5–5.7)
ALKALINE PHOSPHATASE: 50 U/L (ref 34–104)
ALT (SGPT): 20 U/L (ref 7–52)
AST (SGOT): 21 U/L (ref 13–39)
BILIRUBIN DIRECT: 0.1 md/dL (ref <=0.20)
BILIRUBIN TOTAL: 0.4 mg/dL (ref 0.3–1.2)
BILIRUBIN, INDIRECT: 0.3 mg/dL (ref <=1)
GLOBULIN: 2.9 (ref 2.9–5.4)
PROTEIN TOTAL: 7.3 g/dL (ref 6.4–8.9)

## 2022-02-19 LAB — IRON TRANSFERRIN AND TIBC
IRON (TRANSFERRIN) SATURATION: 23 % (ref 15–50)
IRON: 84 ug/dL (ref 50–212)
TOTAL IRON BINDING CAPACITY: 370 ug/dL (ref 250–450)
TRANSFERRIN: 264 mg/dL (ref 203–362)
UIBC: 286 ug/dL (ref 130–375)

## 2022-02-19 LAB — VITAMIN D 25 TOTAL: VITAMIN D: 25 ng/mL — ABNORMAL LOW (ref 30–100)

## 2022-02-19 LAB — VITAMIN B12: VITAMIN B 12: 780 pg/mL (ref 180–914)

## 2022-02-22 LAB — EBV ANTIBODY PROFILE
EBNA ANTIBODY, QUALITATIVE: POSITIVE — AB
EBV VCA IGG ANTIBODY, QUALITATIVE: POSITIVE — AB
EBV VCA IGM ANTIBODY, QUALITATIVE: NEGATIVE

## 2022-03-05 ENCOUNTER — Ambulatory Visit (HOSPITAL_COMMUNITY): Payer: Self-pay

## 2022-03-08 ENCOUNTER — Encounter (HOSPITAL_COMMUNITY): Payer: Self-pay

## 2022-12-30 ENCOUNTER — Other Ambulatory Visit (HOSPITAL_COMMUNITY): Payer: Self-pay

## 2022-12-30 ENCOUNTER — Encounter (HOSPITAL_COMMUNITY): Payer: Self-pay

## 2022-12-30 ENCOUNTER — Ambulatory Visit (HOSPITAL_COMMUNITY)
Admission: RE | Admit: 2022-12-30 | Discharge: 2022-12-30 | Disposition: A | Discharge status: Still In Hospital | Payer: 59 | Source: Ambulatory Visit

## 2022-12-30 ENCOUNTER — Other Ambulatory Visit: Payer: Self-pay

## 2022-12-30 DIAGNOSIS — Z96641 Presence of right artificial hip joint: Secondary | ICD-10-CM

## 2022-12-30 NOTE — PT Evaluation (Signed)
[facility]  Outpatient Physical Therapy  [address]  [place], [address]  [phone]  (Fax) [phone]      Physical Therapy Lower Extremity Evaluation    Date: 12/30/2022  Patient's Name: Stephanie Blackburn  Date of Birth: [birth year]  Physical Therapy Evaluation     PT diagnosis/Reason for Referral: right TKA               SUBJECTIVE  Date of onset: 12/27/22    Mechanism of injury: THA RIGHT     Previous episodes/treatments: THERAPY  HERE 3 years ago, injection therapy     PLOF: room to room ambulator , sometimes used a cane,  pt reports less than 90 degrees flexion before surgery, could not cross her leg.  PMH:  COPD, obesity  Medications for this problem: anti-inflammatory and tramadol, aspirin    Diagnostic tests: x ray of hip     Patient goals: REDUCE PAIN, RETURN TO WORK, and NORMALIZE FUNCTION    Occupation:  Theme park manager, off for summer     Next MD visit: 01/04/23    Pain location: anterior hip, incisional pain , radiates into thigh  ; decreased pain post op                    Pain description:  soreness, incisional pain     Pain frequency:  INTERMITTENT    Pain rating: Now 2   Best 0   Worst 4    Radiculopathy: NA    Pain increases with:  sit to stand, OOB,             decreases with : WALK, POSITION CHANGE, and REST    Sensation: numbness of thigh     Weakness: yes    Sleep affected: not by hip pain    Subjective Functional Reports:    Sitting:  60    Standing:  several minutes    Walking:  house hold ambulator; pt reports walking 50 yards .    Lifting:  unable    Personal ADLS : help with lower body dressing    Household ADLS: unable     Home accessibility: ramped entry , level home  Adaptive equipment: SPC, FWW, shower bench, handicap toilet seats              OBJECTIVE    PROM   right left   Hip Flexion 85 degrees  WFL    Hip Extension 5 0 degrees    Hip Abduction 10 degrees  WFL   Hip Adduction 0 degrees, limited by soft tissue approximation Limited by soft  tissue approximation   Hip IR 0 degrees, tight end feel  10 degrees    Hip ER 35 degrees  40   Knee Flexion WNL WNL   Knee Extension WNL WNL   Ankle DF WNL WNL   Ankle PF WNL WNL   Ankle Inversion WNL WNL   Ankle Eversion WNL WNL   PROM LIMITED BY ROM PRECAUTIONS WITH EXTENSION     Strength: 3-/5 MMT all planes      Sit to stand: mod effort with bilat UE push off and uneven wt. Distribution. Controlled stand to sit with  UE counterbalance    Gait: USES ASSISTIVE DEVICE, RECIPROCATING STEPS WITH ASYMMETRIC STRIDE LENGTH, WIDE BOS, INITIATION OF GAIT WITH HESITATION, and heavy lean onto FWW. Pt is observed to walk in trunk flexion .     Appearance: anterior surgical incision with  dry dressing and Wound Vac in place.     Joint mobility:limited in all planes ( pt reports severe ROM restrictions pre operatively)     Posture: the pt stands and ambulates with trunk lean and COG shifted left of midline        Treatment provided:REVIEW OF POC AND GOALS WITH PATIENT, ALL QUESTIONS ANSWERED, PATIENT EDUCATION, THERAPEUTIC EXERCISE , and evaluation   Access Code: [email or web address]  URL: [email or web address]  Date: 12/30/2022  Prepared by: Lunette Stands    Exercises  - Supine Hip and Knee Flexion AROM with Swiss Ball  - 1 x daily - 7 x weekly - 3 sets - 5 reps  - Ankle and Toe Plantarflexion with Resistance  - 3 x daily - 6 x weekly - 2-3 sets - 10-15 reps  - Seated Hip Adduction Squeeze with Ball  - 2 x daily - 6 x weekly - 3 sets - 8 reps        ASSESSMENT    Impression: the pt presents with decrease weight bearing tolerance, decreased A/PROM, altered gait mechanics and reliance on AD on POD # 3. The pt reports good pain control. She is motivated to participate in rehabilitation .    Rehab potential: FAIR    Short Term Goals: 3 Weeks   -Increase Hip PROM by 10 degrees (FLEX,  ABD, IR, )   -Strength of R LE WFL for supine SLR without extension lag   -LE strength WFL for static stance with symmetric LE wb without increased right  hip pain.  -LE strength WFL for sit to stand with MIN  use of UE to assist    transition.   -Intermittent vs. constant pain. Worst SPS rating less than [4].   -Wean to less restrictive device without deterioration of gait mechanics   -Patient will be independent in HEP     Long Term Goals: 6 Weeks  -LE strength and ROM WFL to allow for normal body mechanics with mobility.  -LE strength WFL for symmetric LE WB during sit to stand without UE assist.   -Resume community ambulation without an assistive device.   -Sleep not disrupted by LE pain. Worst SPS rating less than 3  -LE strength / ROM WFL for patient to negotiate steps reciprocally with rail.         PLAN  Patient will attend 2  times per week x 2 weeks. The pt will FU with MD next week. If pt is able to obtain another RX for therapy we will continue for 4-6 weeks to meet all goals of therapy.  Therapy may include, but is not limited to THERAPEUTIC EXERCISES, POSTURE/BODY MECHANICS, ERGONOMIC TRAINING, TRANSFER/GAIT TRAINING, and HOME INSTRUCTIONS    Plan for next visit : quad and gluteal strengthening , GT, TT ROM        Evaluation complexity:   Personal factors impacting POC: PRE-EXISTING FUNCTIONAL LIMITATIONS   Co-morbidities impacting POC: OBESITY  Complexity of physical exam: INCLUDING MUSCULOSKELETAL SYSTEM (POSTURE, ROM, STRENGTH, HEIGHT/WEIGHT) and INCLUDING NEUROMUSCULAR EXAM (BALANCE, GAIT, LOCOMOTION, MOBILITY)   Clinical Presentation: STABLE   Evaluation Complexity: LOW-HISTORY 0, EXAMINATION 1-2, STABLE PRESENTATION      Total Session Time 48 and Timed code minutes 48         Intervention minutes: EVALUATION 20 minutes and THERAPEUTIC EXERCISE 28 minutes    Lunette Stands, PT  12/30/2022, 1400          Start of Service: _________  Certification:    From:______  Through:_________    I certify the need for these services furnished under this plan of treatment and while under my care.    Referring Provider Signature: _______________     Date :  _____________________    Printed name of Referring Provider________________________________________

## 2023-01-03 ENCOUNTER — Ambulatory Visit (HOSPITAL_COMMUNITY)
Admission: RE | Admit: 2023-01-03 | Discharge: 2023-01-03 | Disposition: A | Discharge status: Still In Hospital | Payer: 59 | Source: Ambulatory Visit

## 2023-01-03 ENCOUNTER — Other Ambulatory Visit: Payer: Self-pay

## 2023-01-03 NOTE — PT Treatment (Signed)
[facility]  Outpatient Physical Therapy  [address]  [place], [address]  [phone]  (Fax) [phone]    Physical Therapy Treatment Note    Date: 01/03/2023  Patient's Name: Stephanie Blackburn  Date of Birth: 1965/08/15  Physical Therapy Visit          Visit #/POC:2 of 4 per MD RX.   Authorization:2 / 20 per FY  POC Signed?: NO  POC Ends: 02/10/23  Next Progress Note Due: TOMORROW      Evaluating Physical Therapist: Lunette Stands PT  PT diagnosis/Reason for Referral: s/p right THA with anterior approach  Next Scheduled Physician Appointment: 01/04/23  Allergies/Contraindications: none          Subjective: pt is sore  in lateral hip  and upper thigh  and groin. She  has occasional incisional pain . She is progressing daily. SPS upon arrival  = 2 . Her pain is intermittent and controlled. She reports no difficulty with her HEP . She will go to MD tomorrow to have her dressing changed but thinks she might keep the Wound Vac for another week.     Objective:  the pt arrives with a fluid, reciprocating gait with FWW.     Measured ROM: NT TODAY  EXERCISE/ACTIVITY NAME REPETITIONS RESISTANCE COMPLETED THIS DOS   Supine ankle PF isotonics with quad and gluteal set  10 Black TB YES, REVIEWED AS PART OF HEP 01/03/23   SUPINE HIP ABD, SHORT ARC ISOTONICS X10 BLACK TB YES ADDED TO HEP 01/03/23   SIT TO STAND FROM ELEVATED SEAT HT.  19 INCH SEAT, 5X, LIGHT UE COUNTERBALANCE  NA YES, ADDED TO HEP 01/03/23   RETRO STEPS IN // BARS, HEEL TO TOE, NORMAL RETURN STEPS  LATERAL STEPS IN // BARS  6 LAPS IN // BARS, MIN UE CONTACT WITH // BARS   L/R , WITH TRUNK STABILIZED NA YES, ADDED TO HEP 01/03/23      YES , ADDED TO HEP 01/03/23   FACILITATION OF HIP MUSCULATURE IN STATIC STANCE WITH MULTIDIRECTIONAL PERTURBATION   IN// BARS FOR SAFETY  30 SEC EACH DIRECTION X 2 HEAVY TUBING, LIGHT INTERMITTENT PULL  YES, ADDED TO HEP 01/03/23                             Access Code: [email or web address]  URL:  [email or web address]  Date: 01/03/2023  Prepared by: Lunette Stands    Exercises  - Standing Balance with Eyes Open  - 1 x daily - 5 x weekly - 4 sets - 10 reps - 30 SEC INTERMITTENT THERABAND PULL , 4 DIRECTIONS  - Hooklying Clamshell with Resistance  - 1 x daily - 5 x weekly - 3 sets - 10 reps  - LATERAL STEPS L/R WITH UE COUNTERBALANCE  -SIT TO STAND FROM ELEVATED SEAT HT.     Assessment: THE PT HAS A FLUID GAIT WITH NORMAL SPEED OVER LEVEL SURFACE USING FWW. She TOLERATED PROGRESSION TO WB LOW LEVEL STABILITY AND MOBILITY EXERCISES WELL. HER WOUND VAC REMAINS IN PLACE. She APPEARS TO HAVE GOOD PAIN CONTROL.    Short Term Goals: 3 Weeks               -Increase Hip PROM by 10 degrees (FLEX,  ABD, IR, )               -Strength of R LE WFL for supine SLR without extension lag               -  LE strength WFL for static stance with symmetric LE wb without increased right hip pain.  -LE strength WFL for sit to stand with MIN  use of UE to assist    transition.               -Intermittent vs. constant pain. Worst SPS rating less than [4].               -Wean to less restrictive device without deterioration of gait mechanics               -Patient will be independent in HEP                 Long Term Goals: 6 Weeks  -LE strength and ROM WFL to allow for normal body mechanics with mobility.  -LE strength WFL for symmetric LE WB during sit to stand without UE assist.               -Resume community ambulation without an assistive device.               -Sleep not disrupted by LE pain. Worst SPS rating less than 3  -LE strength / ROM WFL for patient to negotiate steps reciprocally with rail.    Plan:  FU WITH MD TOMORROW.  RECOMMEND EXTENSION OF MD RX DURATION FOR 4 ADDITIONAL WEEKS BEYOUND ORIGINAL RX FOR 2 WEEK INTERVENTION.     Total Session Time 44 and Timed code minutes 44  THERAPEUTIC EXERCISE 44 minutes      Lunette Stands, PT  01/03/2023, [phone]

## 2023-01-06 ENCOUNTER — Ambulatory Visit
Admission: RE | Admit: 2023-01-06 | Discharge: 2023-01-06 | Disposition: A | Discharge status: Still In Hospital | Payer: 59 | Source: Ambulatory Visit

## 2023-01-06 DIAGNOSIS — Z96641 Presence of right artificial hip joint: Secondary | ICD-10-CM

## 2023-01-06 NOTE — PT Treatment (Signed)
[facility] Medicine [facility]  Outpatient Physical Therapy  [address]  [place], [address]  [phone]  (Fax) [phone]    Physical Therapy Treatment Note    Date: 01/06/2023  Patient's Name: Stephanie Blackburn  Date of Birth: [birth year]  Physical Therapy Visit           Visit #/POC:3 of 4 per MD RX.   Authorization:3 / 20 per FY  POC Signed?: NO  POC Ends: 02/10/23  Next Progress Note Due: TOMORROW        Evaluating Physical Therapist: Lunette Stands PT  PT diagnosis/Reason for Referral: s/p right THA with anterior approach  Next Scheduled Physician Appointment: 01/11/23  Allergies/Contraindications: none              Subjective: Patient states that she has soreness and tightness in her hip. She reports the MD was pleased with her progress and removed the wound vac. States that she will return to her MD on 01/11/23.     Objective:  the pt arrives with a fluid, reciprocating gait with FWW.      Measured ROM: NT TODAY  EXERCISE/ACTIVITY NAME REPETITIONS RESISTANCE COMPLETED THIS DOS   Supine ankle PF isotonics with quad and gluteal set  10 Black TB YES, REVIEWED AS PART OF HEP 01/03/23   SUPINE HIP ABD, SHORT ARC ISOTONICS X10 BLACK TB YES ADDED TO HEP 01/03/23   SIT TO STAND FROM ELEVATED SEAT HT.  19 INCH SEAT, 10X, NO UE COUNTERBALANCE  NA YES, ADDED TO HEP 01/03/23   RETRO STEPS IN // BARS, HEEL TO TOE, NORMAL RETURN STEPS  LATERAL STEPS IN // BARS  6 LAPS IN // BARS, MIN UE CONTACT WITH // BARS   L/R , WITH TRUNK STABILIZED NA YES, ADDED TO HEP 01/03/23        YES , ADDED TO HEP 01/03/23   FACILITATION OF HIP MUSCULATURE IN STATIC STANCE WITH MULTIDIRECTIONAL PERTURBATION    IN// BARS FOR SAFETY  30 SEC EACH DIRECTION X 2 HEAVY TUBING, LIGHT INTERMITTENT PULL  YES, ADDED TO HEP 01/03/23    //bars single UE assist (L)UE fwd walking   x4  N/A  yes    standing // bars short arc hip abduction  x10  no resistance  yes                              Assessment: Noted patient has fluid gait and normal stride  length when ambulating in clinic with FWW. Noted (R) LE weakness. She is making good progress towards goals. She reports no increase in pain with treatment.      Short Term Goals: 3 Weeks               -Increase Hip PROM by 10 degrees (FLEX,  ABD, IR, )               -Strength of R LE WFL for supine SLR without extension lag               -LE strength WFL for static stance with symmetric LE wb without increased right hip pain.  -LE strength WFL for sit to stand with MIN  use of UE to assist    transition.               -Intermittent vs. constant pain. Worst SPS rating less than [4].               -  Wean to less restrictive device without deterioration of gait mechanics               -Patient will be independent in HEP                 Long Term Goals: 6 Weeks  -LE strength and ROM WFL to allow for normal body mechanics with mobility.  -LE strength WFL for symmetric LE WB during sit to stand without UE assist.               -Resume community ambulation without an assistive device.               -Sleep not disrupted by LE pain. Worst SPS rating less than 3  -LE strength / ROM WFL for patient to negotiate steps reciprocally with rail.     Plan:  FU WITH MD TOMORROW.  RECOMMEND EXTENSION OF MD RX DURATION FOR 4 ADDITIONAL WEEKS BEYOUND ORIGINAL RX FOR 2 WEEK INTERVENTION.        Total Session Time 38 and Timed code minutes 38  THERAPEUTIC EXERCISE 38 minutes      Trilby Leaver, PTA  01/06/2023, 14:39

## 2023-01-10 ENCOUNTER — Ambulatory Visit
Admission: RE | Admit: 2023-01-10 | Discharge: 2023-01-10 | Disposition: A | Discharge status: Still In Hospital | Payer: 59 | Source: Ambulatory Visit

## 2023-01-10 ENCOUNTER — Other Ambulatory Visit: Payer: Self-pay

## 2023-01-10 DIAGNOSIS — Z96641 Presence of right artificial hip joint: Secondary | ICD-10-CM | POA: Insufficient documentation

## 2023-01-10 NOTE — PT Treatment (Addendum)
[facility] Medicine [facility]  Outpatient Physical Therapy  [address]  [place], [address]  [phone]  (Fax) [phone]    Physical Therapy Treatment Note    Date: 01/10/2023  Patient's Name: Stephanie Blackburn  Date of Birth: [birth year]  Physical Therapy Visit        Visit #/POC:4 of 12 per MD RX.   Authorization:4 / 20 per FY  POC Signed?: NO  POC Ends: 02/10/23  Next Progress Note Due: TOMORROW        Evaluating Physical Therapist: Lunette Stands PT  PT diagnosis/Reason for Referral: s/p right THA with anterior approach  Next Scheduled Physician Appointment: 01/11/23  Allergies/Contraindications: none        02/15/23 ADDENDUM: THIS PT CANCELLED HER NEXT 3 APPOINTMENTS . She ATTENDED 4 SESSIONS ONLY FROM [facility] ON 12/30/22. AS OF 02/15/23 She HAD NOT CONTACTED THE PHYSICAL THERAPY DEPARTMENT TO SCHEDULE APPOINTMENTS. HER POC TIME FRAME ENDED 02/02/23. SEE THE BELOW NOTE FOR HER STATUS UPON HER LAST SESSION ATTENDED . THE PT IS DISCHARGED AFTER A 5 WEEK LAPSE SINCE HER LAST APPOINTMENT.  Lunette Stands, PT      Subjective:  SPS upon arrival = 1 ; pt notes tightness and soreness of her lateral thigh. She will see the hospitalist tomorrow and anticipates her stitches will be removed. She notes an area of redness around her incision that popped up yesterday. She feels ready for cane use . She does walk a few steps in her house with no AD. She was given an updated RX for an additional 4 weeks of therapy. She hopes to move back to her own house next week but will have to climb steps to enter home.      Objective:  the pt arrives with a fluid, reciprocating gait with FWW.      EXERCISE/ACTIVITY NAME REPETITIONS RESISTANCE COMPLETED THIS DOS   Supine ankle PF isotonics with quad and gluteal set     Standing heel raises  10        X 10, recruiting [facility] Black TB no, REVIEWED AS PART OF HEP 01/03/23      ADDED TO HEP 01/10/23   SUPINE HIP ABD, SHORT ARC ISOTONICS X10 BLACK TB NO ADDED TO HEP 01/03/23   SIT TO STAND FROM  ELEVATED SEAT HT.  19 INCH SEAT, 5X, NO UE COUNTERBALANCE  NA YES, ADDED TO HEP 01/03/23   RETRO STEPS IN // BARS, HEEL TO TOE, NORMAL RETURN STEPS  LATERAL STEPS IN // BARS  6 LAPS IN // BARS, MIN UE CONTACT WITH // BARS   L/R , WITH TRUNK STABILIZED HEAVY TUBING YES, ADDED TO HEP 01/03/23          NO, ADDED TO HEP 01/03/23   FACILITATION OF HIP MUSCULATURE IN STATIC STANCE WITH MULTIDIRECTIONAL PERTURBATION    IN// BARS FOR SAFETY  30 SEC EACH DIRECTION X 2 HEAVY TUBING, LIGHT INTERMITTENT PULL  NO, ADDED TO HEP 01/03/23    //bars single UE assist (L)UE fwd walking   x4  N/A  NO    standing // bars short arc hip abduction  x10  no resistance  NO    gt with SPC      YES    UP/DOWN 5 STEPS   ONE RAIL AND SPC    YES            Assessment: the pt has a fluid but slightly asymmetric gait with use of SPC. She can perform a  supine SLR without an  extension lag but notes groin strain.   Short Term Goals: 3 Weeks               -Increase Hip PROM by 10 degrees (FLEX,  ABD, IR, )               -Strength of R LE WFL for supine SLR without extension lag ( met 01/10/23)               -LE strength WFL for static stance with symmetric LE wb without increased right hip pain.  -LE strength WFL for sit to stand with MIN  use of UE to assist    transition. ( Met 01/10/23)               -Intermittent vs. constant pain. Worst SPS rating less than [4]. ( Met 01/10/23)               -Wean to less restrictive device without deterioration of gait mechanics ( met 01/10/23)               -Patient will be independent in HEP                 Long Term Goals: 6 Weeks  -LE strength and ROM WFL to allow for normal body mechanics with mobility.  -LE strength WFL for symmetric LE WB during sit to stand without UE assist.               -Resume community ambulation without an assistive device.               -Sleep not disrupted by LE pain. Worst SPS rating less than 3  -LE strength / ROM WFL for patient to negotiate steps reciprocally with rail.      Plan:  continue with functional strengthening, balance and proprioception exercises. Gradual wean to [facility].  The pt's POC is extended for 2x week x 4 additional weeks for a total of 6 weeks from [facility].     Total Session Time 29 and Timed code minutes 29  THERAPEUTIC EXERCISE 29 minutes  Lunette Stands, PT   1210

## 2023-01-12 ENCOUNTER — Ambulatory Visit (HOSPITAL_COMMUNITY): Payer: Self-pay

## 2023-01-17 ENCOUNTER — Ambulatory Visit (HOSPITAL_COMMUNITY): Payer: Self-pay

## 2023-01-20 ENCOUNTER — Ambulatory Visit (HOSPITAL_COMMUNITY): Payer: Self-pay

## 2023-01-24 ENCOUNTER — Other Ambulatory Visit (HOSPITAL_COMMUNITY): Payer: Self-pay | Admitting: ORTHOPAEDIC SURGERY

## 2023-01-24 DIAGNOSIS — Z96641 Presence of right artificial hip joint: Secondary | ICD-10-CM

## 2023-05-04 ENCOUNTER — Other Ambulatory Visit (INDEPENDENT_AMBULATORY_CARE_PROVIDER_SITE_OTHER): Payer: Self-pay | Admitting: FAMILY PRACTICE

## 2023-05-04 MED ORDER — BUPROPION HCL XL 300 MG 24 HR TABLET, EXTENDED RELEASE
300.0000 mg | ORAL_TABLET | Freq: Every morning | ORAL | 0 refills | Status: DC
Start: 2023-05-04 — End: 2024-01-16

## 2023-06-22 ENCOUNTER — Encounter (INDEPENDENT_AMBULATORY_CARE_PROVIDER_SITE_OTHER): Payer: Self-pay | Admitting: PHYSICIAN ASSISTANT

## 2023-06-22 ENCOUNTER — Ambulatory Visit (INDEPENDENT_AMBULATORY_CARE_PROVIDER_SITE_OTHER): Payer: Self-pay | Admitting: PHYSICIAN ASSISTANT

## 2023-06-22 ENCOUNTER — Other Ambulatory Visit: Payer: Self-pay

## 2023-06-22 VITALS — BP 126/76 | HR 81 | Temp 97.5°F | Ht 62.0 in | Wt 296.0 lb

## 2023-06-22 DIAGNOSIS — D649 Anemia, unspecified: Secondary | ICD-10-CM | POA: Insufficient documentation

## 2023-06-22 DIAGNOSIS — R7303 Prediabetes: Secondary | ICD-10-CM | POA: Insufficient documentation

## 2023-06-22 DIAGNOSIS — M25561 Pain in right knee: Secondary | ICD-10-CM

## 2023-06-22 DIAGNOSIS — J45909 Unspecified asthma, uncomplicated: Secondary | ICD-10-CM | POA: Insufficient documentation

## 2023-06-22 DIAGNOSIS — M25361 Other instability, right knee: Secondary | ICD-10-CM

## 2023-06-22 MED ORDER — BACLOFEN 5 MG TABLET
5.0000 mg | ORAL_TABLET | Freq: Two times a day (BID) | ORAL | 0 refills | Status: AC
Start: 2023-06-22 — End: 2023-07-07

## 2023-06-22 MED ORDER — KETOROLAC 30 MG/ML (1 ML) INJECTION SOLUTION
30.0000 mg | Freq: Once | INTRAMUSCULAR | Status: AC
Start: 2023-06-22 — End: 2023-06-22
  Administered 2023-06-22: 30 mg via INTRAMUSCULAR

## 2023-06-22 MED ORDER — PREDNISONE 10 MG TABLET
20.0000 mg | ORAL_TABLET | Freq: Every day | ORAL | 0 refills | Status: AC
Start: 2023-06-22 — End: 2023-06-29

## 2023-06-22 NOTE — Nursing Note (Signed)
06/22/23 0825   Depression Screen   Little interest or pleasure in doing things. 0   Feeling down, depressed, or hopeless 1   PHQ 2 Total 1

## 2023-06-22 NOTE — Progress Notes (Signed)
FAMILY MEDICINE, MEDICAL OFFICE BUILDING  [address]  [place] [address]      Stephanie Blackburn  07/14/1965  [id number]    Date of Service: 06/22/2023  8:00 AM EST    Chief complaint:   Chief Complaint   Patient presents with    Knee Pain     During thanksgiving did a lot of walking and standing more than what she is used to, also had hip replacement on rt side in June. Sciatica has also been bothering her. Went to sit down Monday night and tucked her left leg behind rt in chair and heard her right knee area pop.  Alternated ice and heat yesterday, ibuprofen, ultram and bactroban.        Subjective:      57 y.o. year old female who comes in today for evaluation of right knee pain. She is known to me from my previous practice at [facility]. States went to sit down two nights ago and crossed leg behind her and R knee popped. She has taken Baclofen, ibuprofen and Ultram without complete relief. Bearing weight and bending increases pain. Keeping leg straight relieves the pain. She notes most the pain is posteriorly. She reports applying ice and heat and keeping elevated. Some popping since but not consistent. The knee had been a little sore prior but this incident increased the pain significantly. She reports it does feel a ltitle unstable at times.     ROS:  Reviewed as negative unless otherwise mentioned in the HPI.    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    Iron deficiency anemia 10/07/2021       Past Surgical History:   Procedure Laterality Date    CYSTECTOMY      on uterius    GASTRIC BYPASS OPEN      HX BREAST REDUCTION      HX CARPAL TUNNEL RELEASE Bilateral     HX CHOLECYSTECTOMY      HX COLONOSCOPY      HX HERNIA REPAIR      HX HIP REPLACEMENT Right     HX TONSILLECTOMY      LYMPH NODE BIOPSY      THIGH LIFT             Current Outpatient Medications   Medication Sig    albuterol  sulfate (PROVENTIL OR VENTOLIN OR PROAIR) 90 mcg/actuation Inhalation oral inhaler INHALE 2 PUFFS BY MOUTH 4 TIMES DAILY    ascorbic acid, vitamin C, (VITAMIN C) 500 mg Oral Tablet Take 1 Tablet (500 mg total) by mouth Once a day    baclofen (LIORESAL) 5 mg Oral Tablet Take 1 Tablet (5 mg total) by mouth Twice daily for 15 days    BD LUER-LOK SYRINGE 3 mL 25 gauge x 1" Syringe USE 1 SYRINGE ONCE A WEEK    benzonatate (TESSALON) 100 mg Oral Capsule TAKE 1 CAPSULE BY MOUTH THREE TIMES DAILY AS NEEDED    buPROPion (WELLBUTRIN XL) 300 mg extended release 24 hr tablet Take 1 Tablet (300 mg total) by mouth Every morning for 90 days    cyanocobalamin (VITAMIN B12) 1,000 mcg/mL Injection Solution INJECT 1 ML (CC) INTRAMUSCULARLY ONCE A WEEK    ergocalciferol, vitamin D2, (DRISDOL) 1,250 mcg (50,000 unit) Oral Capsule TAKE 2 CAPSULES A WEEK WITH FOOD AS  DIRECTED    fluticasone propionate (FLONASE) 50 mcg/actuation Nasal Spray, Suspension Administer 2 Sprays into each nostril Once a day    loratadine (CLARITIN) 10 mg Oral Tablet Take 1 Tablet (10 mg total) by mouth Once a day (Patient not taking: Reported on 06/22/2023)    meloxicam (MOBIC) 7.5 mg Oral Tablet Take 1 Tablet (7.5 mg total) by mouth Twice daily (Patient not taking: Reported on 06/22/2023)    metFORMIN (GLUCOPHAGE XR) 500 mg Oral Tablet Sustained Release 24 hr Take 1 Tablet (500 mg total) by mouth Once a day (Patient not taking: Reported on 06/22/2023)    montelukast (SINGULAIR) 10 mg Oral Tablet Take 1 Tablet (10 mg total) by mouth Once a day    MYRBETRIQ 25 mg Oral Tablet Sustained Release 24 hr Take 2 Tablets (50 mg total) by mouth Once a day    oxyBUTYnin chloride (DITROPAN XL) 10 mg Oral Tablet Extended Rel 24 hr Take 1 Tablet (10 mg total) by mouth Once a day (Patient not taking: Reported on 06/22/2023)    predniSONE (DELTASONE) 10 mg Oral Tablet Take 2 Tablets (20 mg total) by mouth Once a day for 7 days    sertraline (ZOLOFT) 100 mg Oral Tablet Take 1  Tablet (100 mg total) by mouth Once a day    traMADoL (ULTRAM) 50 mg Oral Tablet Take 1 Tablet (50 mg total) by mouth Three times a day as needed    TRELEGY ELLIPTA 100-62.5-25 mcg Inhalation Disk with Device Take 1 Inhalation by inhalation Once a day    zinc Sulfate (ZINCATE) 50 mg zinc (220 mg) Oral Tablet Take 1 Tablet (50 mg total) by mouth Three times a day (Patient not taking: Reported on 06/22/2023)       Objective:     BP 126/76   Pulse 81   Temp 36.4 C (97.5 F) (Temporal)   Ht 1.575 m (5\' 2" )   Wt 134 kg (296 lb)   SpO2 95%   BMI 54.14 kg/m       BMI addressed: Advised on diet, weight loss, and exercise to reduce above normal BMI.     General appearance: alert, cooperative, in no acute distress  Head: normocephalic, atraumatic.   Eyes: PERRL, EOMI.   Lungs: clear to auscultation bilaterally; no wheezes or rhonchi   Heart: regular rate and rhythm; no murmur  Musculoskeletal: R knee with crepitus with ROM. Tenderness along the lateral and posterior aspect of the knee. No joint tenderness. Negative anterior drawer.  Psych: alert and oriented x 3  Neuro: CN 2-12 grossly intact    Assessment/Plan     Assessment & Plan  Right knee pain  We will try prednisone and baclofen PO along with a IM Toradol inj hold NSAIDs today.   Orders:    ketorolac (TORADOL) 30 mg/mL injection    predniSONE (DELTASONE) 10 mg Oral Tablet; Take 2 Tablets (20 mg total) by mouth Once a day for 7 days    baclofen (LIORESAL) 5 mg Oral Tablet; Take 1 Tablet (5 mg total) by mouth Twice daily for 15 days    MRI KNEE RIGHT WO CONTRAST; Future    Knee instability, right  Due to the instability and crepitus on exam I am going to obtain an MRI.  Orders:    ketorolac (TORADOL) 30 mg/mL injection    MRI KNEE RIGHT WO CONTRAST; Future                     The  patient was given the opportunity to ask questions and those questions were answered to the patient's satisfaction. The patient was encouraged to call with any additional questions or  concerns. Instructed patient to call back if symptoms worse.   Discussed with patient effects and side effects of medications. Medication safety was discussed. A copy of the patient's medication list was printed and given to the patient. A good faith effort was made to reconcile the patient's medications.       Follow up: Return if symptoms worsen or fail to improve.    Bonner Puna, PA-C

## 2023-06-22 NOTE — Nursing Note (Signed)
06/22/23 0825   Recent Weight Change   Have you had a recent unexplained weight loss or gain? N   Health Education and Literacy   How often do you have a problem understanding what is told to you about your medical condition?  Never   Domestic Violence   Because we are aware of abuse and domestic violence today, we ask all patients: Are you being hurt, hit, or frightened by anyone at your home or in your life?  N   Basic Needs   Do you have any basic needs within your home that are not being met? (such as Food, Shelter, [employer] fast streamer, Tranportation, paying for bills and/or medications) N   Advanced Directives   Do you have any advanced directives? No Advance   Would you like an advanced directive packet? Refused Packet

## 2023-06-24 IMAGING — MR MRI KNEE RT W/O CONTRAST
5 series · 35 of 40 positions shown · non-contrast
Comparison: None previous.

﻿EXAM:  63614   MRI KNEE RT W/O CONTRAST
INDICATION: 57-year-old female with severe right knee pain.  Patient Yusi Hemani upon sitting.  Posterior knee pain.  No history of knee surgery.
TECHNIQUE: Axial, coronal and sagittal images as per protocol.

[Series 5: PD fat-sat · axial · right · 5.0mm · 0.39mm/px · z∈[-114,+45]mm · 8 of 30 slices shown (1 of 3)]
[im 1/30]
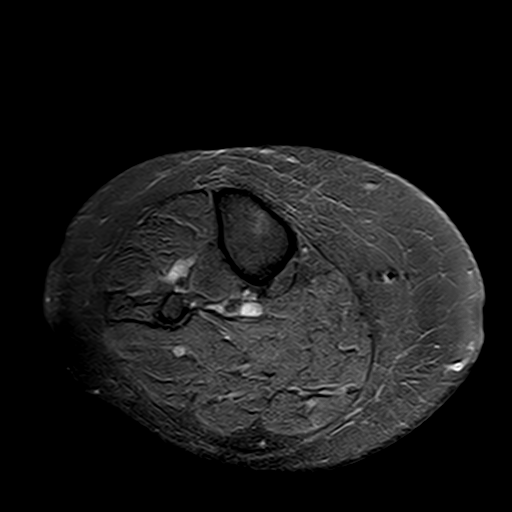
[im 5/30]
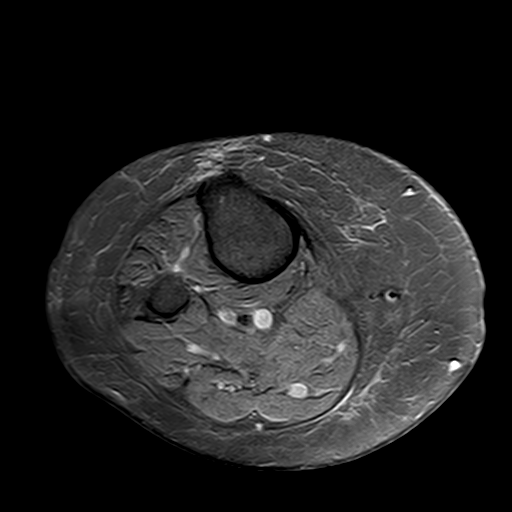
[im 9/30]
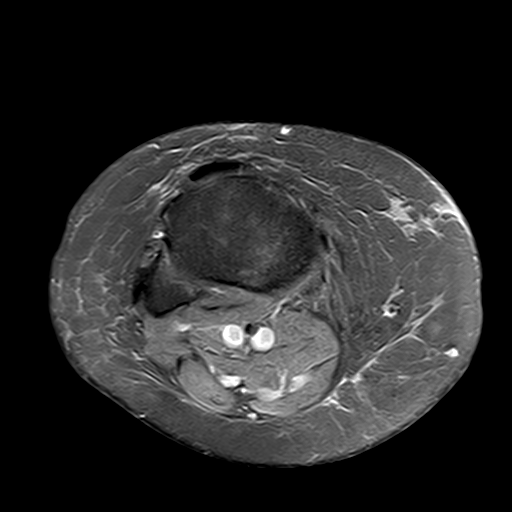
[im 13/30]
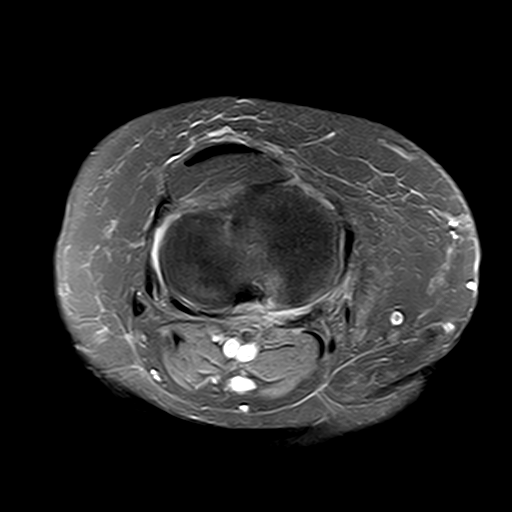
[im 17/30]
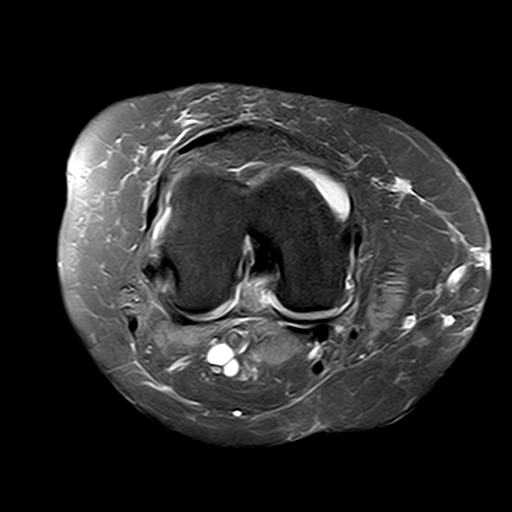
[im 21/30]
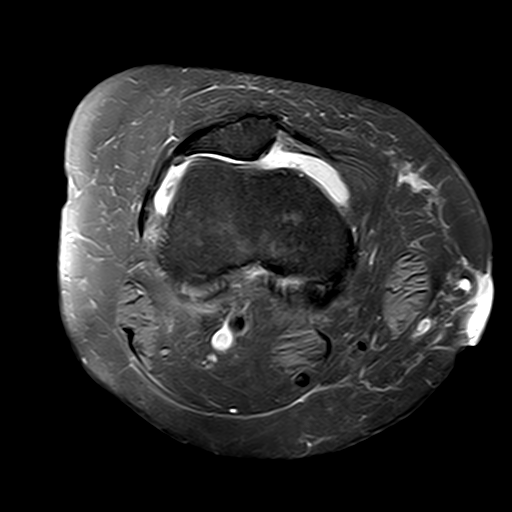
[im 25/30]
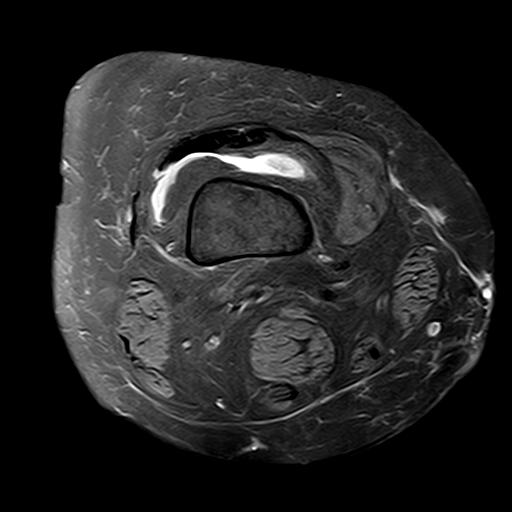
[im 30/30]
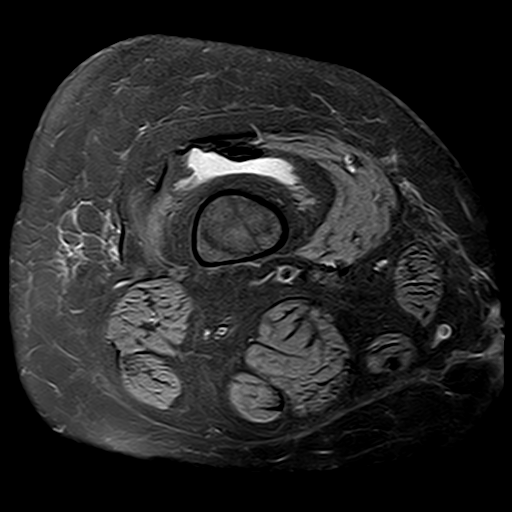

[Series 6: PD fat-sat · sagittal · right · 4.5mm · 0.56mm/px · 8 of 30 slices shown (2 of 3)]
[im 1/30]
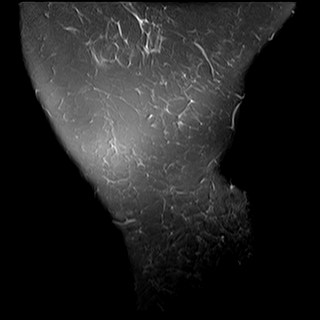
[im 5/30]
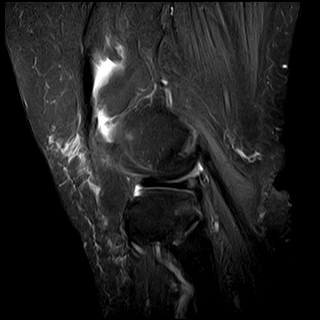
[im 9/30]
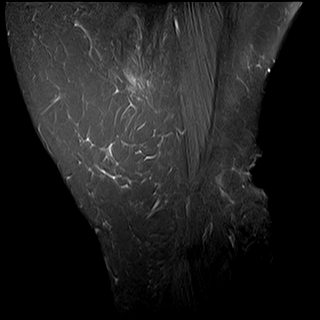
[im 13/30]
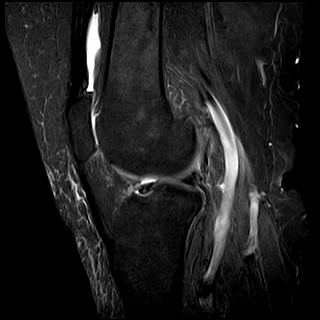
[im 17/30]
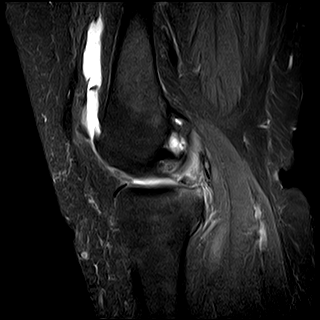
[im 21/30]
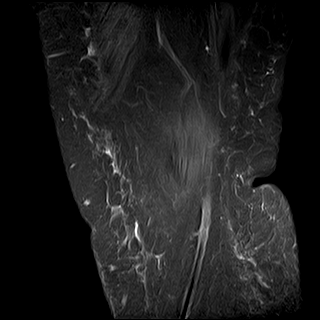
[im 25/30]
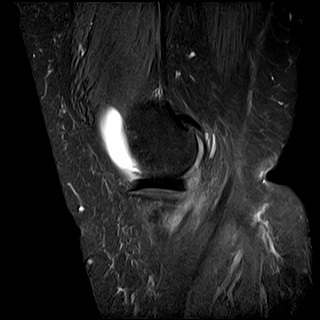
[im 30/30]
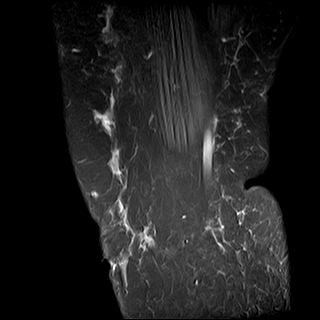

[Series 7: T1 · sagittal · right · 4.5mm · 0.56mm/px · 8 of 30 slices shown]
[im 1/30]
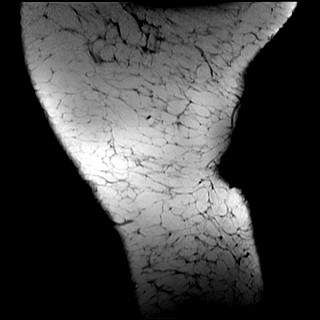
[im 5/30]
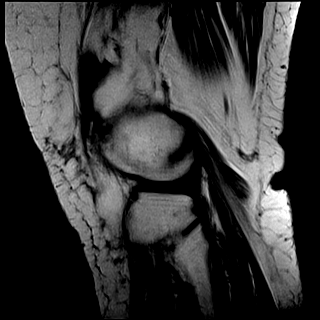
[im 9/30]
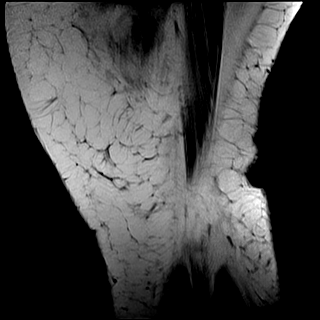
[im 13/30]
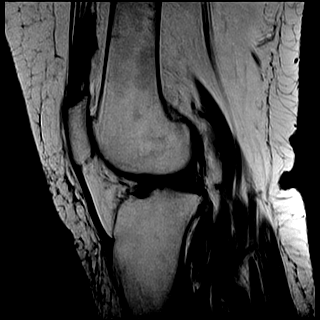
[im 17/30]
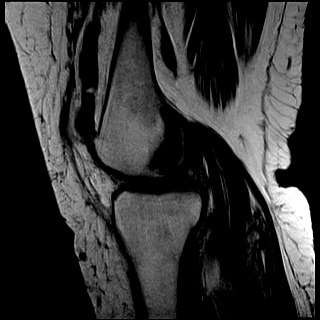
[im 21/30]
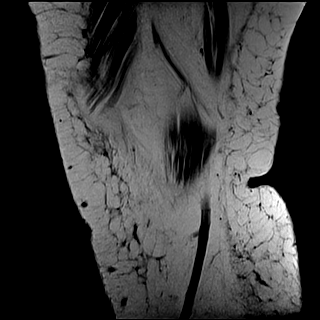
[im 25/30]
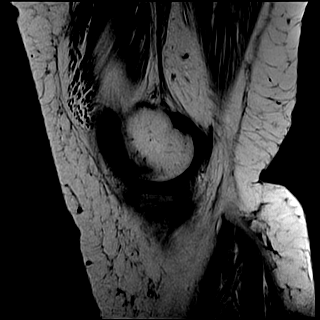
[im 30/30]
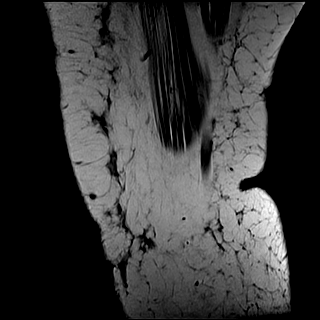

[Series 8: STIR · coronal · right · 4.0mm · 0.76mm/px · 3 of 32 slices shown]
[im 1/32]
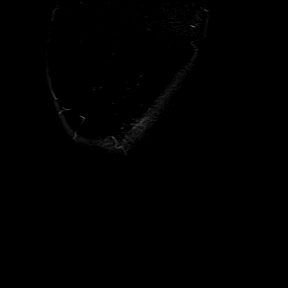
[im 5/32]
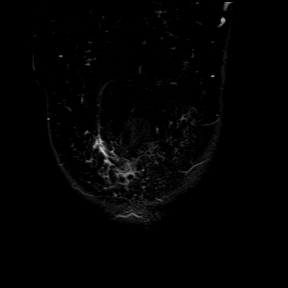
[im 9/32]
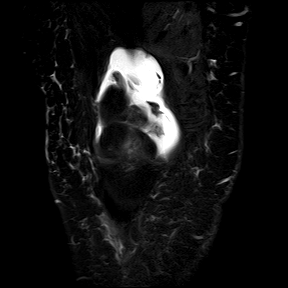

[Series 9: PD fat-sat · coronal · right · 4.0mm · 0.69mm/px · 8 of 32 slices shown (3 of 3)]
[im 1/32]
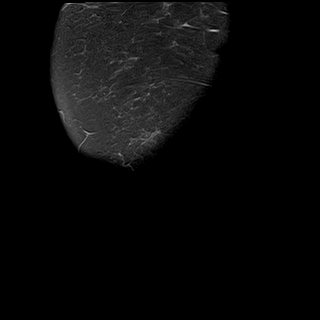
[im 5/32]
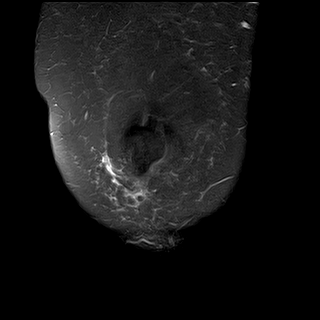
[im 9/32]
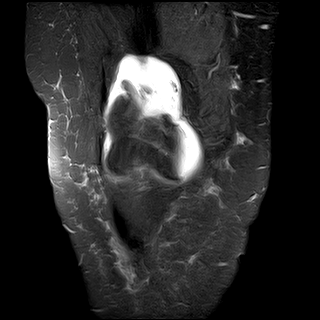
[im 14/32]
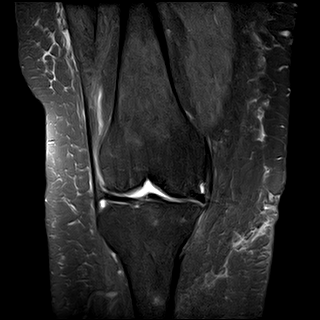
[im 18/32]
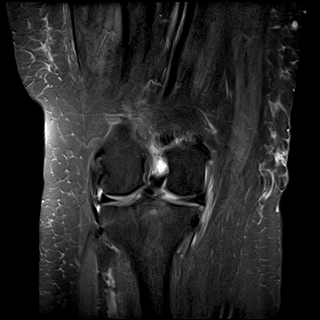
[im 23/32]
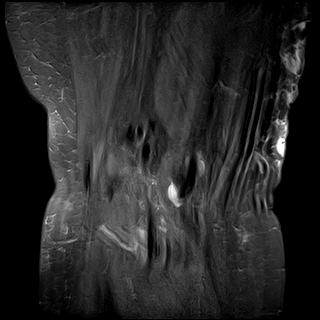
[im 27/32]
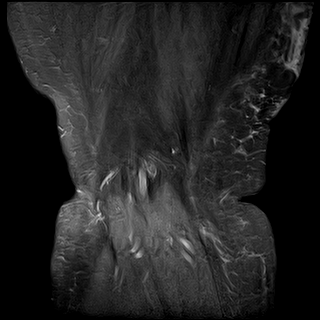
[im 32/32]
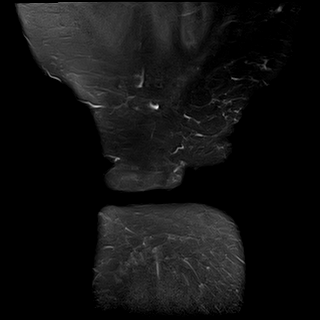

[35 of 40 positions shown; findings below may reference images not displayed]

FINDINGS: No acute bony lesions are seen at the right knee.  The lateral meniscus and lateral articular cartilage are normal.  

Anterior and posterior cruciate ligaments are intact.  The medial meniscus shows no acute findings.  Grade 2 degenerative changes of medial articular cartilage are noted.  Collateral ligaments are intact. 

Moderate patella alta.  Advanced grade 4 degenerative changes of lateral facet of the patellofemoral articulation are noted with minimal lateral subluxation at the patellofemoral articulation.  Quadriceps tendon and patellar tendon are intact.  Subcutaneous edema around the knee joint is noted with varicose superficial veins on the medial aspect.  Small effusion is noted in the knee joint.
IMPRESSION: 1. Grade 2 degenerative changes of medial articular cartilage. 

2. Advanced grade 4 degenerative changes of lateral facet of patellofemoral articulation with moderate patella alta.  Minimal lateral subluxation at the patellofemoral articulation.

3. Small effusion in the knee joint and subcutaneous edema around the knee joint.  Varicose superficial veins.

## 2023-06-27 ENCOUNTER — Other Ambulatory Visit: Payer: Self-pay

## 2023-06-29 ENCOUNTER — Encounter (INDEPENDENT_AMBULATORY_CARE_PROVIDER_SITE_OTHER): Payer: Self-pay | Admitting: PHYSICIAN ASSISTANT

## 2023-06-29 ENCOUNTER — Ambulatory Visit (INDEPENDENT_AMBULATORY_CARE_PROVIDER_SITE_OTHER): Payer: 59 | Admitting: PHYSICIAN ASSISTANT

## 2023-06-29 ENCOUNTER — Other Ambulatory Visit: Payer: Self-pay

## 2023-06-29 VITALS — BP 141/86 | HR 98 | Temp 97.5°F | Ht 62.0 in | Wt 298.0 lb

## 2023-06-29 DIAGNOSIS — J069 Acute upper respiratory infection, unspecified: Secondary | ICD-10-CM

## 2023-06-29 DIAGNOSIS — J4520 Mild intermittent asthma, uncomplicated: Secondary | ICD-10-CM

## 2023-06-29 DIAGNOSIS — M25561 Pain in right knee: Secondary | ICD-10-CM

## 2023-06-29 DIAGNOSIS — M25562 Pain in left knee: Secondary | ICD-10-CM

## 2023-06-29 MED ORDER — AZITHROMYCIN 250 MG TABLET
ORAL_TABLET | ORAL | 0 refills | Status: DC
Start: 2023-06-29 — End: 2023-09-28

## 2023-06-29 MED ORDER — METHYLPREDNISOLONE 4 MG TABLETS IN A DOSE PACK
ORAL_TABLET | ORAL | 1 refills | Status: DC
Start: 2023-06-29 — End: 2023-09-28

## 2023-06-29 NOTE — Progress Notes (Signed)
FAMILY MEDICINE, MEDICAL OFFICE BUILDING  [address]  [place] [address]      Progress Note    Stephanie Blackburn  02/15/1966  [id number]    Date of Service: 06/29/2023  3:30 PM EST    Chief complaint:   Chief Complaint   Patient presents with    Follow Up       Subjective:     This is a case of a 57 y.o. year old female who comes in today for review of mri of right knee. Pt.states she felt a pop behind knee bad and started crying due to pain, feels "musculature"  the baclofen and prednsione helped and feeling some better. No injuries or falls, pt.had mri of right knee. Pt will  see Dr.Bulock her orthopedist in [place]      Review of Systems   Constitutional:  Positive for fatigue. Negative for chills and fever.   HENT:  Positive for congestion, postnasal drip, sinus pressure and sore throat. Negative for trouble swallowing and voice change.    Respiratory:  Positive for cough and chest tightness. Negative for choking, shortness of breath and wheezing.         Pt.noticed she's used albuterol more lately with relief  couple days.    Cardiovascular:  Negative for chest pain and palpitations.   Musculoskeletal:         Pt.describes right posterior knee previously pain with an episode of extreme pain and felt a pop/burst like sensation and decrease in pain after this. Pt.states the pain is mainly in the right popliteal area, into upper part of posterior leg.   Neurological:  Positive for light-headedness and headaches. Negative for dizziness.        Current Outpatient Medications   Medication Sig    albuterol sulfate (PROVENTIL OR VENTOLIN OR PROAIR) 90 mcg/actuation Inhalation oral inhaler INHALE 2 PUFFS BY MOUTH 4 TIMES DAILY    ascorbic acid, vitamin C, (VITAMIN C) 500 mg Oral Tablet Take 1 Tablet (500 mg total) by mouth Once a day    azithromycin (ZITHROMAX) 250 mg Oral Tablet Take 500 mg (2 tab) on day 1; take 250 mg (1 tab) on days 2-5.    baclofen (LIORESAL) 5 mg Oral Tablet Take 1 Tablet (5 mg total)  by mouth Twice daily for 15 days    BD LUER-LOK SYRINGE 3 mL 25 gauge x 1" Syringe USE 1 SYRINGE ONCE A WEEK    benzonatate (TESSALON) 100 mg Oral Capsule TAKE 1 CAPSULE BY MOUTH THREE TIMES DAILY AS NEEDED    buPROPion (WELLBUTRIN XL) 300 mg extended release 24 hr tablet Take 1 Tablet (300 mg total) by mouth Every morning for 90 days    cyanocobalamin (VITAMIN B12) 1,000 mcg/mL Injection Solution INJECT 1 ML (CC) INTRAMUSCULARLY ONCE A WEEK    ergocalciferol, vitamin D2, (DRISDOL) 1,250 mcg (50,000 unit) Oral Capsule TAKE 2 CAPSULES A WEEK WITH FOOD AS DIRECTED    fluticasone propionate (FLONASE) 50 mcg/actuation Nasal Spray, Suspension Administer 2 Sprays into each nostril Once a day    loratadine (CLARITIN) 10 mg Oral Tablet Take 1 Tablet (10 mg total) by mouth Once a day (Patient not taking: Reported on 06/22/2023)    meloxicam (MOBIC) 7.5 mg Oral Tablet Take 1 Tablet (7.5 mg total) by mouth Twice daily (Patient not taking: Reported on 06/22/2023)    metFORMIN (GLUCOPHAGE XR) 500 mg Oral Tablet Sustained Release 24 hr Take 1 Tablet (500 mg total) by mouth Once a day (Patient  not taking: Reported on 06/22/2023)    Methylprednisolone (MEDROL, PAK,) 4 mg Oral Tablets, Dose Pack Take as instructed.    montelukast (SINGULAIR) 10 mg Oral Tablet Take 1 Tablet (10 mg total) by mouth Once a day    MYRBETRIQ 25 mg Oral Tablet Sustained Release 24 hr Take 2 Tablets (50 mg total) by mouth Once a day    oxyBUTYnin chloride (DITROPAN XL) 10 mg Oral Tablet Extended Rel 24 hr Take 1 Tablet (10 mg total) by mouth Once a day (Patient not taking: Reported on 06/22/2023)    sertraline (ZOLOFT) 100 mg Oral Tablet Take 1 Tablet (100 mg total) by mouth Once a day    traMADoL (ULTRAM) 50 mg Oral Tablet Take 1 Tablet (50 mg total) by mouth Three times a day as needed    TRELEGY ELLIPTA 100-62.5-25 mcg Inhalation Disk with Device Take 1 Inhalation by inhalation Once a day    zinc Sulfate (ZINCATE) 50 mg zinc (220 mg) Oral Tablet Take 1  Tablet (50 mg total) by mouth Three times a day (Patient not taking: Reported on 06/22/2023)       Objective:     BP (!) 141/86   Pulse 98   Temp 36.4 C (97.5 F) (Temporal)   Ht 1.575 m (5\' 2" )   Wt 135 kg (298 lb)   SpO2 98%   BMI 54.50 kg/m     Physical Exam  Vitals and nursing note reviewed.   Constitutional:       Appearance: Normal appearance.   HENT:      Head: Normocephalic and atraumatic.      Right Ear: Tympanic membrane normal.      Left Ear: Tympanic membrane normal.      Nose: Congestion present.      Mouth/Throat:      Mouth: Mucous membranes are moist.      Pharynx: Posterior oropharyngeal erythema present.   Eyes:      Extraocular Movements: Extraocular movements intact.      Conjunctiva/sclera: Conjunctivae normal.   Cardiovascular:      Rate and Rhythm: Normal rate and regular rhythm.      Heart sounds: Normal heart sounds.   Pulmonary:      Breath sounds: Normal breath sounds.   Musculoskeletal:      Comments: Right popliteal/posterior area with mild tenderness, no redness or warmth noted, posterior calf pain noted    Neurological:      Mental Status: She is alert.        Assessment & Plan  Acute URI  Zpack #1/0  Medrol dose pack #1/0   If uri sx continue then start above meds       Mild intermittent asthma, unspecified whether complicated  Continue inhalers and start nebs if sx increase       Posterior right knee pain  Review MRI of right knee with pt.at office today  Discuss possibility of a Bakers cyst on right? Pt.to discuss with her orthopedic at the appt. In Jan. 2025          Other orders    Methylprednisolone (MEDROL, PAK,) 4 mg Oral Tablets, Dose Pack; Take as instructed.    azithromycin (ZITHROMAX) 250 mg Oral Tablet; Take 500 mg (2 tab) on day 1; take 250 mg (1 tab) on days 2-5.          The patient was given the opportunity to ask questions and those questions were answered to the patient's satisfaction. The patient was encouraged  to call with any additional questions or  concerns.    Follow up: RTC as scheduled     Dewayne Shorter, PA-C

## 2023-06-29 NOTE — Assessment & Plan Note (Addendum)
Continue inhalers and start nebs if sx increase

## 2023-07-01 ENCOUNTER — Telehealth (INDEPENDENT_AMBULATORY_CARE_PROVIDER_SITE_OTHER): Payer: Self-pay | Admitting: PHYSICIAN ASSISTANT

## 2023-07-01 MED ORDER — BENZONATATE 200 MG CAPSULE
200.0000 mg | ORAL_CAPSULE | Freq: Three times a day (TID) | ORAL | 0 refills | Status: DC
Start: 2023-07-01 — End: 2024-04-06

## 2023-07-01 NOTE — Telephone Encounter (Signed)
Patient left a vmail stating she is having some drainage and wants to know if you would send her some tessalon pearls to [facility].

## 2023-07-14 ENCOUNTER — Other Ambulatory Visit: Payer: Self-pay

## 2023-07-19 ENCOUNTER — Other Ambulatory Visit (INDEPENDENT_AMBULATORY_CARE_PROVIDER_SITE_OTHER): Payer: Self-pay | Admitting: PHYSICIAN ASSISTANT

## 2023-09-27 ENCOUNTER — Encounter (INDEPENDENT_AMBULATORY_CARE_PROVIDER_SITE_OTHER): Payer: Self-pay | Admitting: PHYSICIAN ASSISTANT

## 2023-09-28 ENCOUNTER — Other Ambulatory Visit: Payer: Self-pay

## 2023-09-28 ENCOUNTER — Ambulatory Visit (INDEPENDENT_AMBULATORY_CARE_PROVIDER_SITE_OTHER): Payer: 59 | Admitting: PHYSICIAN ASSISTANT

## 2023-09-28 ENCOUNTER — Encounter (INDEPENDENT_AMBULATORY_CARE_PROVIDER_SITE_OTHER): Payer: Self-pay | Admitting: PHYSICIAN ASSISTANT

## 2023-09-28 VITALS — BP 134/79 | HR 86 | Temp 98.4°F | Resp 16 | Ht 62.0 in | Wt 306.0 lb

## 2023-09-28 DIAGNOSIS — Z6841 Body Mass Index (BMI) 40.0 and over, adult: Secondary | ICD-10-CM | POA: Insufficient documentation

## 2023-09-28 DIAGNOSIS — M2550 Pain in unspecified joint: Secondary | ICD-10-CM

## 2023-09-28 DIAGNOSIS — E559 Vitamin D deficiency, unspecified: Secondary | ICD-10-CM | POA: Insufficient documentation

## 2023-09-28 DIAGNOSIS — N3281 Overactive bladder: Secondary | ICD-10-CM | POA: Insufficient documentation

## 2023-09-28 DIAGNOSIS — M79605 Pain in left leg: Secondary | ICD-10-CM

## 2023-09-28 DIAGNOSIS — Z96641 Presence of right artificial hip joint: Secondary | ICD-10-CM | POA: Insufficient documentation

## 2023-09-28 DIAGNOSIS — E538 Deficiency of other specified B group vitamins: Secondary | ICD-10-CM

## 2023-09-28 DIAGNOSIS — E611 Iron deficiency: Secondary | ICD-10-CM

## 2023-09-28 DIAGNOSIS — M1611 Unilateral primary osteoarthritis, right hip: Secondary | ICD-10-CM | POA: Insufficient documentation

## 2023-09-28 DIAGNOSIS — M5440 Lumbago with sciatica, unspecified side: Secondary | ICD-10-CM

## 2023-09-28 DIAGNOSIS — J302 Other seasonal allergic rhinitis: Secondary | ICD-10-CM | POA: Insufficient documentation

## 2023-09-28 DIAGNOSIS — M79604 Pain in right leg: Secondary | ICD-10-CM

## 2023-09-28 DIAGNOSIS — M7061 Trochanteric bursitis, right hip: Secondary | ICD-10-CM | POA: Insufficient documentation

## 2023-09-28 DIAGNOSIS — F419 Anxiety disorder, unspecified: Secondary | ICD-10-CM | POA: Insufficient documentation

## 2023-09-28 DIAGNOSIS — M7631 Iliotibial band syndrome, right leg: Secondary | ICD-10-CM | POA: Insufficient documentation

## 2023-09-28 DIAGNOSIS — F321 Major depressive disorder, single episode, moderate: Secondary | ICD-10-CM

## 2023-09-28 DIAGNOSIS — D519 Vitamin B12 deficiency anemia, unspecified: Secondary | ICD-10-CM

## 2023-09-28 MED ORDER — SERTRALINE 100 MG TABLET
100.0000 mg | ORAL_TABLET | Freq: Every day | ORAL | 0 refills | Status: DC
Start: 2023-09-28 — End: 2023-12-29

## 2023-09-28 MED ORDER — LEVOCETIRIZINE 5 MG TABLET
5.0000 mg | ORAL_TABLET | Freq: Every day | ORAL | 1 refills | Status: DC
Start: 2023-09-28 — End: 2024-04-06

## 2023-09-28 MED ORDER — BUPROPION HCL XL 150 MG 24 HR TABLET, EXTENDED RELEASE
150.0000 mg | ORAL_TABLET | Freq: Every day | ORAL | 0 refills | Status: DC
Start: 2023-09-28 — End: 2024-01-16

## 2023-09-28 MED ORDER — CLINDAMYCIN 1 %-BENZOYL PEROXIDE 5 % TOPICAL GEL
Freq: Two times a day (BID) | CUTANEOUS | 1 refills | Status: DC
Start: 2023-09-28 — End: 2024-04-06

## 2023-09-28 MED ORDER — MYRBETRIQ 25 MG TABLET,EXTENDED RELEASE
50.0000 mg | ORAL_TABLET | Freq: Every day | ORAL | 0 refills | Status: DC
Start: 2023-09-28 — End: 2023-10-24

## 2023-09-28 MED ORDER — MONTELUKAST 10 MG TABLET
10.0000 mg | ORAL_TABLET | Freq: Every evening | ORAL | 1 refills | Status: DC
Start: 2023-09-28 — End: 2024-04-06

## 2023-09-28 NOTE — Nursing Note (Signed)
 09/28/23 1553   Domestic Violence   Because we are aware of abuse and domestic violence today, we ask all patients: Are you being hurt, hit, or frightened by anyone at your home or in your life?  N   Basic Needs   Do you have any basic needs within your home that are not being met? (such as Food, Shelter, [employer], Tranportation, paying for bills and/or medications) N

## 2023-09-28 NOTE — Progress Notes (Signed)
 FAMILY MEDICINE, MEDICAL OFFICE BUILDING  [address] [place] [address]      Progress Note    Stephanie Blackburn  Apr 28, 1966  [id number]    Date of Service: 09/28/2023  3:30 PM EDT    Chief complaint:   Chief Complaint   Patient presents with    Follow Up 3 Months     Still having right knee pain. Also having continuous back pain. Says it has been going on for a long time but would like it to be checked.         Subjective:     This is a case of a 58 y.o. year old female who comes in today for evaluation. Pt.reports she continues to have right knee pain and will see ortho  4/25. Pt.c/o back pain for years that is getting worse and now can only stand 5 minutes and back hurts bad and must bend over for some relief. Pt.felt the back pain was related to her hip. 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 Pt.would like to restart her Wellbutrin, she is having increase stress and feels stressed. Pt.c/o leg pain and aches and increased at hs.       Review of Systems   Constitutional:  Positive for activity change (decreased activities due to knee and back pain) and fatigue. Negative for appetite change and unexpected weight change.   HENT: Negative.     Respiratory: Negative.     Cardiovascular: Negative.    Gastrointestinal: Negative.    Genitourinary:         Urinary incontinence pt.on meds      Musculoskeletal:  Positive for arthralgias, back pain and joint swelling.        Right knee w/ edema and tenderness,  Back pain see HPI   Psychiatric/Behavioral:  Positive for dysphoric mood and sleep disturbance.       Current Outpatient Medications   Medication Sig    albuterol sulfate (PROVENTIL OR VENTOLIN OR PROAIR) 90 mcg/actuation Inhalation oral inhaler INHALE 2 PUFFS BY MOUTH 4 TIMES DAILY    ascorbic acid, vitamin C, (VITAMIN C) 500 mg Oral Tablet Take 1 Tablet (500 mg total) by mouth Daily    Benzonatate (TESSALON) 200 mg Oral Capsule Take 1 Capsule (200 mg total)  by mouth Three times a day (Patient taking differently: Take 1 Capsule (200 mg total) by mouth Every 4 hours as needed for Cough)    buPROPion (WELLBUTRIN XL) 150 mg extended release 24 hr tablet Take 1 Tablet (150 mg total) by mouth Daily    buPROPion (WELLBUTRIN XL) 300 mg extended release 24 hr tablet Take 1 Tablet (300 mg total) by mouth Every morning for 90 days    Clindamycin-Benzoyl Peroxide 1-5 % Gel Apply topically Twice daily Apply to affected area after the skin has been cleansed and dried.    Cyanocobalamin-Cobamamide (B-12 PLUS) 5,000-100 mcg Sublingual Tablet, Sublingual Place 1 Tablet under the tongue Daily    ergocalciferol, vitamin D2, (DRISDOL) 1,250 mcg (50,000 unit) Oral Capsule TAKE 2 CAPSULES A WEEK WITH FOOD AS DIRECTED    fluticasone propionate (FLONASE) 50 mcg/actuation Nasal Spray, Suspension Administer 2 Sprays into each nostril Once per day as needed for Other    Levocetirizine (XYZAL) 5 mg Oral Tablet Take 1 Tablet (5 mg total) by mouth Daily    montelukast (SINGULAIR) 10 mg Oral Tablet Take 1 Tablet (10 mg total) by mouth Every night  MYRBETRIQ 25 mg Oral Tablet Sustained Release 24 hr Take 2 Tablets (50 mg total) by mouth Daily    sertraline (ZOLOFT) 100 mg Oral Tablet Take 1 Tablet (100 mg total) by mouth Daily    traMADoL (ULTRAM) 50 mg Oral Tablet Take 1 Tablet (50 mg total) by mouth Three times a day as needed    TRELEGY ELLIPTA 100-62.5-25 mcg Inhalation Disk with Device Take 1 Inhalation by inhalation Daily    zinc Sulfate (ZINCATE) 50 mg zinc (220 mg) Oral Tablet Take 1 Tablet (50 mg total) by mouth Three times a day       Objective:     BP 134/79   Pulse 86   Temp 36.9 C (98.4 F) (Temporal)   Resp 16   Ht 1.575 m (5\' 2" )   Wt (!) 139 kg (306 lb)   SpO2 95%   BMI 55.97 kg/m        Physical Exam  Vitals and nursing note reviewed.   Constitutional:       General: She is not in acute distress.     Appearance: Normal appearance.   HENT:      Head: Normocephalic and  atraumatic.      Right Ear: Tympanic membrane normal.      Left Ear: Tympanic membrane normal.      Nose: Nose normal.   Eyes:      Extraocular Movements: Extraocular movements intact.      Conjunctiva/sclera: Conjunctivae normal.   Cardiovascular:      Rate and Rhythm: Normal rate and regular rhythm.      Heart sounds: Normal heart sounds.   Pulmonary:      Effort: Pulmonary effort is normal.      Breath sounds: Normal breath sounds.   Musculoskeletal:         General: Swelling and tenderness present. No signs of injury.      Comments: Right knee with edematous changes noted and gait with  limp  Back is tender at left lower back area,, decreased rotation of back.   Deferred heel to toe exam due to unsteadiness.    Skin:     General: Skin is warm and dry.   Neurological:      General: No focal deficit present.      Mental Status: She is alert and oriented to person, place, and time. Mental status is at baseline.   Psychiatric:         Mood and Affect: Mood normal.         Behavior: Behavior normal.         Thought Content: Thought content normal.         Judgment: Judgment normal.           Assessment & Plan  Lumbar pain with radiation down both legs      Orders:    XR LUMBAR SPINE AP/OBLIQUES/LAT/SPOT; Future    BASIC METABOLIC PANEL; Future    CBC/DIFF; Future    Iron deficiency  Pt.has h/o iron def. And had iron  infusions in the past.  Orders:    IRON TRANSFERRIN AND TIBC; Future    B12 deficiency  Check B12 and determine dose after lab   Orders:    VITAMIN B12; Future    Arthralgia    Orders:    RHEUMATOID FACTOR, SERUM; Future    Leg pain, bilateral  Check labs and if WNL consider meds for RLS       Moderate major depression (  CMS HCC)  Restart the Wellbutrin XL 150 mg 1 tab daily in am     Refill pts.other maintenance meds also today        Other orders    buPROPion (WELLBUTRIN XL) 150 mg extended release 24 hr tablet; Take 1 Tablet (150 mg total) by mouth Daily    sertraline (ZOLOFT) 100 mg Oral Tablet; Take  1 Tablet (100 mg total) by mouth Daily    Clindamycin-Benzoyl Peroxide 1-5 % Gel; Apply topically Twice daily Apply to affected area after the skin has been cleansed and dried.    Levocetirizine (XYZAL) 5 mg Oral Tablet; Take 1 Tablet (5 mg total) by mouth Daily    montelukast (SINGULAIR) 10 mg Oral Tablet; Take 1 Tablet (10 mg total) by mouth Every night    MYRBETRIQ 25 mg Oral Tablet Sustained Release 24 hr; Take 2 Tablets (50 mg total) by mouth Daily          The patient was given the opportunity to ask questions and those questions were answered to the patient's satisfaction. The patient was encouraged to call with any additional questions or concerns.    Follow up: Return in about 6 weeks (around 11/09/2023).    Dewayne Shorter, PA-C

## 2023-09-29 ENCOUNTER — Other Ambulatory Visit (INDEPENDENT_AMBULATORY_CARE_PROVIDER_SITE_OTHER): Payer: Self-pay | Admitting: PHYSICIAN ASSISTANT

## 2023-09-29 DIAGNOSIS — M545 Low back pain, unspecified: Secondary | ICD-10-CM

## 2023-10-22 ENCOUNTER — Other Ambulatory Visit (INDEPENDENT_AMBULATORY_CARE_PROVIDER_SITE_OTHER): Payer: Self-pay | Admitting: PHYSICIAN ASSISTANT

## 2023-11-10 ENCOUNTER — Other Ambulatory Visit: Payer: Self-pay

## 2023-11-11 ENCOUNTER — Ambulatory Visit (INDEPENDENT_AMBULATORY_CARE_PROVIDER_SITE_OTHER): Payer: Self-pay | Admitting: PHYSICIAN ASSISTANT

## 2023-12-29 ENCOUNTER — Other Ambulatory Visit (INDEPENDENT_AMBULATORY_CARE_PROVIDER_SITE_OTHER): Payer: Self-pay | Admitting: PHYSICIAN ASSISTANT

## 2023-12-29 MED ORDER — SERTRALINE 100 MG TABLET
100.0000 mg | ORAL_TABLET | Freq: Every day | ORAL | 0 refills | Status: DC
Start: 2023-12-29 — End: 2024-01-24

## 2023-12-30 ENCOUNTER — Ambulatory Visit (INDEPENDENT_AMBULATORY_CARE_PROVIDER_SITE_OTHER): Payer: Self-pay | Admitting: PHYSICIAN ASSISTANT

## 2024-01-16 ENCOUNTER — Other Ambulatory Visit: Payer: Self-pay

## 2024-01-16 ENCOUNTER — Encounter (INDEPENDENT_AMBULATORY_CARE_PROVIDER_SITE_OTHER): Payer: Self-pay | Admitting: PHYSICIAN ASSISTANT

## 2024-01-16 ENCOUNTER — Ambulatory Visit: Payer: Self-pay | Attending: Internal Medicine | Admitting: PHYSICIAN ASSISTANT

## 2024-01-16 VITALS — BP 125/83 | HR 83 | Temp 97.6°F | Ht 62.0 in | Wt 305.0 lb

## 2024-01-16 DIAGNOSIS — D509 Iron deficiency anemia, unspecified: Secondary | ICD-10-CM | POA: Insufficient documentation

## 2024-01-16 DIAGNOSIS — E538 Deficiency of other specified B group vitamins: Secondary | ICD-10-CM | POA: Insufficient documentation

## 2024-01-16 DIAGNOSIS — J4520 Mild intermittent asthma, uncomplicated: Secondary | ICD-10-CM | POA: Insufficient documentation

## 2024-01-16 DIAGNOSIS — R590 Localized enlarged lymph nodes: Secondary | ICD-10-CM | POA: Insufficient documentation

## 2024-01-16 DIAGNOSIS — F32A Depression, unspecified: Secondary | ICD-10-CM | POA: Insufficient documentation

## 2024-01-16 DIAGNOSIS — F411 Generalized anxiety disorder: Secondary | ICD-10-CM | POA: Insufficient documentation

## 2024-01-16 MED ORDER — CEFDINIR 300 MG CAPSULE
300.0000 mg | ORAL_CAPSULE | Freq: Two times a day (BID) | ORAL | 0 refills | Status: AC
Start: 2024-01-16 — End: 2024-01-26

## 2024-01-16 MED ORDER — BUPROPION HCL XL 300 MG 24 HR TABLET, EXTENDED RELEASE
300.0000 mg | ORAL_TABLET | Freq: Every day | ORAL | 0 refills | Status: DC
Start: 2024-01-16 — End: 2024-04-06

## 2024-01-16 NOTE — Progress Notes (Signed)
 FAMILY MEDICINE, MEDICAL OFFICE BUILDING  [address]  [place] [address]  Operated by [facility]    Progress Note    Stephanie Blackburn  Dec 31, 1965  [id number]    Date of Service: 01/16/2024  9:30 AM EDT    Chief complaint:   Chief Complaint   Patient presents with    Follow Up     6 week follow up  Wants left ear looked at. Thinks something may have bit her.        Subjective:     This is a case of a 58 y.o. year old female who comes in today for follow up Pt.reports she is doing ok except having increased stress with family and their problems. Pt.feels unmotivated and fatigue for months. Pt.states she is taking meds as directed. Pt.continues to have leg pain/aches at times. Pt.states increase pain in legs at bedtime. No numbness in legs. Pt.continues to see her orthopedic for her hip.      Review of Systems   Constitutional:  Positive for fatigue. Negative for chills and fever.   HENT:          Left ear pain/knot behind ear she felt last night.no trauma or injury   Respiratory: Negative.     Cardiovascular: Negative.    Gastrointestinal: Negative.    Neurological:  Negative for dizziness and headaches.   Psychiatric/Behavioral:  Positive for dysphoric mood and sleep disturbance (trouble going to sleep at times). The patient is nervous/anxious.         Pt.describes panic attack sx and she must talk herself out of it.         Current Outpatient Medications   Medication Sig    albuterol  sulfate (PROVENTIL  OR VENTOLIN  OR PROAIR ) 90 mcg/actuation Inhalation oral inhaler INHALE 2 PUFFS BY MOUTH 4 TIMES DAILY    Benzonatate  (TESSALON ) 200 mg Oral Capsule Take 1 Capsule (200 mg total) by mouth Three times a day (Patient taking differently: Take 1 Capsule (200 mg total) by mouth Every 4 hours as needed for Cough)    buPROPion  (WELLBUTRIN  XL) 300 mg extended release 24 hr tablet Take 1 Tablet (300 mg total) by mouth Daily    cefdinir  (OMNICEF ) 300 mg Oral Capsule Take 1 Capsule (300 mg total) by mouth  Twice daily for 10 days    Clindamycin -Benzoyl Peroxide  1-5 % Gel Apply topically Twice daily Apply to affected area after the skin has been cleansed and dried.    fluticasone propionate (FLONASE) 50 mcg/actuation Nasal Spray, Suspension Administer 2 Sprays into each nostril Once per day as needed for Other    Levocetirizine (XYZAL) 5 mg Oral Tablet Take 1 Tablet (5 mg total) by mouth Daily    mirabegron  (MYRBETRIQ ) 25 mg Oral Tablet Sustained Release 24 hr TAKE ONE TABLET BY MOUTH EVERY DAY    montelukast  (SINGULAIR ) 10 mg Oral Tablet Take 1 Tablet (10 mg total) by mouth Every night    sertraline  (ZOLOFT ) 100 mg Oral Tablet Take 1 Tablet (100 mg total) by mouth Daily       Objective:     BP 125/83   Pulse 83   Temp 36.4 C (97.6 F) (Temporal)   Ht 1.575 m (5' 2)   Wt (!) 138 kg (305 lb)   SpO2 94%   BMI 55.79 kg/m        Physical Exam  Vitals and nursing note reviewed.   Constitutional:       General: She is not in  acute distress.     Appearance: Normal appearance.   HENT:      Head: Normocephalic and atraumatic.      Right Ear: Tympanic membrane normal.      Left Ear: Tympanic membrane normal.      Ears:      Comments: Left post-auricular area with a small firm area and into the pre-auricular area firmness and prominence noted     Nose: Nose normal.      Mouth/Throat:      Mouth: Mucous membranes are dry.      Pharynx: Oropharynx is clear.   Eyes:      Extraocular Movements: Extraocular movements intact.      Conjunctiva/sclera: Conjunctivae normal.   Cardiovascular:      Rate and Rhythm: Normal rate and regular rhythm.      Heart sounds: Normal heart sounds.   Pulmonary:      Effort: Pulmonary effort is normal.      Breath sounds: Normal breath sounds.   Lymphadenopathy:      Cervical: No cervical adenopathy.   Neurological:      General: No focal deficit present.      Mental Status: She is alert. Mental status is at baseline.   Psychiatric:         Mood and Affect: Mood normal.         Behavior:  Behavior normal.           Assessment & Plan  Mild intermittent asthma, unspecified whether complicated  Pt.uses inhalers prn       Depression  Increase WELLBUTRIN  XL 300 MG 1 DAY #90/-  Increase Zoloft  100 mg  1.5 tabs a day    Orders:    HGA1C (HEMOGLOBIN A1C WITH EST AVG GLUCOSE); Future    BASIC METABOLIC PANEL; Future    GAD (generalized anxiety disorder)  See above med changes       Posterior auricular lymphadenopathy    Orders:    EBV ANTIBODY PROFILE; Future    Lymphadenopathy, periauricular    Orders:    CBC/DIFF; Future    HEPATIC FUNCTION PANEL; Future    EBV ANTIBODY PROFILE; Future    Iron  deficiency anemia  Pt.has had iron  infusions in the past, will check labs and determine next plan.  Orders:    IRON  TRANSFERRIN AND TIBC; Future    CBC/DIFF; Future    B12 deficiency    Orders:    VITAMIN B12; Future    Consider meds for RLS after labs reviewed     Other orders    buPROPion  (WELLBUTRIN  XL) 300 mg extended release 24 hr tablet; Take 1 Tablet (300 mg total) by mouth Daily    cefdinir  (OMNICEF ) 300 mg Oral Capsule; Take 1 Capsule (300 mg total) by mouth Twice daily for 10 days          The patient was given the opportunity to ask questions and those questions were answered to the patient's satisfaction. The patient was encouraged to call with any additional questions or concerns.    Follow up: Return in about 1 month (around 02/16/2024), or 30 minutes.    Gerardine Ammon, PA-C

## 2024-01-16 NOTE — Nursing Note (Signed)
 01/16/24 9052   Domestic Violence   Because we are aware of abuse and domestic violence today, we ask all patients: Are you being hurt, hit, or frightened by anyone at your home or in your life?  N   Basic Needs   Do you have any basic needs within your home that are not being met? (such as Food, Shelter, [employer], Tranportation, paying for bills and/or medications) N

## 2024-01-16 NOTE — Assessment & Plan Note (Addendum)
 Pt.uses inhalers prn

## 2024-01-16 NOTE — Nursing Note (Signed)
 01/16/24 0948   PHQ 9 (follow up)   Little interest or pleasure in doing things. 1   Feeling down, depressed, or hopeless 1   Trouble falling or staying asleep, or sleeping too much. 1   Feeling tired or having little energy 3   Poor appetite or overeating 0   Feeling bad about yourself/ that you are a failure in the past 2 weeks? 0   Trouble concentrating on things in the past 2 weeks? 0   Moving/Speaking slowly or being fidgety or restless  in the past 2 weeks? 0   Thoughts that you would be better off DEAD, or of hurting yourself in some way. 0   If you checked off any problems, how difficult have these problems made it for you to do your work, take care of things at home, or get along with other people? Somewhat difficult   PHQ 9 Total 6   Interpretation of Total Score Mild depression

## 2024-01-16 NOTE — Assessment & Plan Note (Addendum)
 Pt.has had iron  infusions in the past, will check labs and determine next plan.  Orders:    IRON  TRANSFERRIN AND TIBC; Future    CBC/DIFF; Future

## 2024-01-17 LAB — A1C (POINT OF CARE): POCT HGB A1C: 6.5 % — AB (ref 4–6)

## 2024-01-23 ENCOUNTER — Other Ambulatory Visit (INDEPENDENT_AMBULATORY_CARE_PROVIDER_SITE_OTHER): Payer: Self-pay | Admitting: PHYSICIAN ASSISTANT

## 2024-01-23 ENCOUNTER — Telehealth (INDEPENDENT_AMBULATORY_CARE_PROVIDER_SITE_OTHER): Payer: Self-pay | Admitting: PHYSICIAN ASSISTANT

## 2024-01-23 DIAGNOSIS — E119 Type 2 diabetes mellitus without complications: Secondary | ICD-10-CM | POA: Insufficient documentation

## 2024-01-23 MED ORDER — ALCOHOL SWABS: MEDICATED_PAD | CUTANEOUS | 0 refills | Status: AC

## 2024-01-23 MED ORDER — LANCETS 26 GAUGE
1.0000 | Freq: Every day | 0 refills | Status: AC
Start: 2024-01-23 — End: ?

## 2024-01-23 MED ORDER — MOUNJARO 2.5 MG/0.5 ML SUBCUTANEOUS PEN INJECTOR
2.5000 mg | PEN_INJECTOR | SUBCUTANEOUS | 1 refills | Status: DC
Start: 2024-01-23 — End: 2024-04-06

## 2024-01-23 MED ORDER — BLOOD GLUCOSE TEST STRIPS
1.0000 | ORAL_STRIP | Freq: Every day | 1 refills | Status: AC
Start: 2024-01-23 — End: ?

## 2024-01-23 MED ORDER — CYANOCOBALAMIN (VIT B-12) 1,000 MCG/ML INJECTION SOLUTION
INTRAMUSCULAR | 1 refills | Status: DC
Start: 2024-01-23 — End: 2024-04-06

## 2024-01-23 MED ORDER — SYRINGE 3 ML 25 GAUGE X 1"
INJECTION | 1 refills | Status: DC
Start: 2024-01-23 — End: 2024-04-06

## 2024-01-23 MED ORDER — BLOOD-GLUCOSE METER
1.0000 | Freq: Every day | 0 refills | Status: AC
Start: 2024-01-23 — End: ?

## 2024-01-23 NOTE — Telephone Encounter (Signed)
 Pt wants zoloft  upped stated it was supposed to be upped at last visit and wants it to be sent it to [facility]

## 2024-01-24 ENCOUNTER — Other Ambulatory Visit (INDEPENDENT_AMBULATORY_CARE_PROVIDER_SITE_OTHER): Payer: Self-pay | Admitting: PHYSICIAN ASSISTANT

## 2024-01-24 MED ORDER — SERTRALINE 100 MG TABLET
150.0000 mg | ORAL_TABLET | Freq: Every day | ORAL | 0 refills | Status: DC
Start: 2024-01-24 — End: 2024-04-06

## 2024-02-17 ENCOUNTER — Ambulatory Visit (INDEPENDENT_AMBULATORY_CARE_PROVIDER_SITE_OTHER): Payer: Self-pay | Admitting: PHYSICIAN ASSISTANT

## 2024-03-30 ENCOUNTER — Encounter (INDEPENDENT_AMBULATORY_CARE_PROVIDER_SITE_OTHER): Payer: Self-pay | Admitting: PHYSICIAN ASSISTANT

## 2024-03-30 ENCOUNTER — Other Ambulatory Visit (INDEPENDENT_AMBULATORY_CARE_PROVIDER_SITE_OTHER): Payer: Self-pay

## 2024-04-06 ENCOUNTER — Ambulatory Visit: Payer: Self-pay | Attending: PHYSICIAN ASSISTANT | Admitting: PHYSICIAN ASSISTANT

## 2024-04-06 ENCOUNTER — Encounter (INDEPENDENT_AMBULATORY_CARE_PROVIDER_SITE_OTHER): Payer: Self-pay | Admitting: PHYSICIAN ASSISTANT

## 2024-04-06 ENCOUNTER — Other Ambulatory Visit: Payer: Self-pay

## 2024-04-06 VITALS — BP 132/79 | HR 74 | Temp 98.2°F | Ht 62.0 in | Wt 303.0 lb

## 2024-04-06 DIAGNOSIS — J45909 Unspecified asthma, uncomplicated: Secondary | ICD-10-CM | POA: Insufficient documentation

## 2024-04-06 DIAGNOSIS — Z6841 Body Mass Index (BMI) 40.0 and over, adult: Secondary | ICD-10-CM | POA: Insufficient documentation

## 2024-04-06 DIAGNOSIS — Z1231 Encounter for screening mammogram for malignant neoplasm of breast: Secondary | ICD-10-CM | POA: Insufficient documentation

## 2024-04-06 DIAGNOSIS — Z7689 Persons encountering health services in other specified circumstances: Secondary | ICD-10-CM | POA: Insufficient documentation

## 2024-04-06 DIAGNOSIS — E119 Type 2 diabetes mellitus without complications: Secondary | ICD-10-CM | POA: Insufficient documentation

## 2024-04-06 DIAGNOSIS — F419 Anxiety disorder, unspecified: Secondary | ICD-10-CM | POA: Insufficient documentation

## 2024-04-06 MED ORDER — SYRINGE 3 ML 25 GAUGE X 1"
INJECTION | 1 refills | Status: AC
Start: 2024-04-06 — End: ?

## 2024-04-06 MED ORDER — BUPROPION HCL XL 300 MG 24 HR TABLET, EXTENDED RELEASE
300.0000 mg | ORAL_TABLET | Freq: Every day | ORAL | 0 refills | Status: AC
Start: 2024-04-06 — End: ?

## 2024-04-06 MED ORDER — CYANOCOBALAMIN (VIT B-12) 1,000 MCG/ML INJECTION SOLUTION
INTRAMUSCULAR | 1 refills | Status: AC
Start: 2024-04-06 — End: ?

## 2024-04-06 MED ORDER — MIRABEGRON ER 50 MG TABLET,EXTENDED RELEASE 24 HR
50.0000 mg | ORAL_TABLET | Freq: Every day | ORAL | 0 refills | Status: AC
Start: 2024-04-06 — End: ?

## 2024-04-06 MED ORDER — SERTRALINE 100 MG TABLET: 150.0000 mg | ORAL_TABLET | Freq: Every day | ORAL | 0 refills | Status: DC

## 2024-04-06 MED ORDER — LEVOCETIRIZINE 5 MG TABLET
5.0000 mg | ORAL_TABLET | Freq: Every day | ORAL | 1 refills | Status: AC
Start: 2024-04-06 — End: ?

## 2024-04-06 MED ORDER — MONTELUKAST 10 MG TABLET
10.0000 mg | ORAL_TABLET | Freq: Every evening | ORAL | 1 refills | Status: AC
Start: 2024-04-06 — End: ?

## 2024-04-06 MED ORDER — CLINDAMYCIN 1 %-BENZOYL PEROXIDE 5 % TOPICAL GEL
Freq: Two times a day (BID) | CUTANEOUS | 1 refills | Status: AC
Start: 2024-04-06 — End: ?

## 2024-04-06 NOTE — Nursing Note (Signed)
 04/06/24 1619   PHQ 9 (follow up)   Little interest or pleasure in doing things. 1   Feeling down, depressed, or hopeless 1   Trouble falling or staying asleep, or sleeping too much. 0   Feeling tired or having little energy 3   Poor appetite or overeating 1   Feeling bad about yourself/ that you are a failure in the past 2 weeks? 0   Trouble concentrating on things in the past 2 weeks? 1   Moving/Speaking slowly or being fidgety or restless  in the past 2 weeks? 0   Thoughts that you would be better off DEAD, or of hurting yourself in some way. 0   If you checked off any problems, how difficult have these problems made it for you to do your work, take care of things at home, or get along with other people? Somewhat difficult   PHQ 9 Total 7   Interpretation of Total Score Mild depression

## 2024-04-06 NOTE — Assessment & Plan Note (Signed)
 Orders:    HGA1C (HEMOGLOBIN A1C WITH EST AVG GLUCOSE); Future    LIPID PANEL; Future    BASIC METABOLIC PANEL; Future    HEPATIC FUNCTION PANEL; Future

## 2024-04-06 NOTE — Progress Notes (Unsigned)
 FAMILY MEDICINE, MEDICAL OFFICE BUILDING  [address]  [place] [address]  Operated by [facility]    Progress Note    Stephanie Blackburn  [birth year]  [id number]    Date of Service: 04/06/2024  4:30 PM EDT    Chief complaint:   Chief Complaint   Patient presents with    Follow Up       Subjective:     This is a case of a 58 y.o. year old female who comes in today for follow up. Pt.states she is having increase stress with family. Pt.states she is handling stress and meds are doing well. Pt.states she is taking Melatonin with relief. Pt.voices no new concerns.      Review of Systems   Constitutional: Negative.    HENT: Negative.     Respiratory: Negative.     Cardiovascular:  Positive for palpitations (palpitations resolved pst 2 weeks). Negative for chest pain.   Gastrointestinal: Negative.    Neurological:  Positive for dizziness (position changes when lies down) and headaches (left temporal area and pressure.).   Psychiatric/Behavioral:  Positive for sleep disturbance (Melatonin with relief).         See hpi        Current Outpatient Medications   Medication Sig    albuterol  sulfate (PROVENTIL  OR VENTOLIN  OR PROAIR ) 90 mcg/actuation Inhalation oral inhaler INHALE 2 PUFFS BY MOUTH 4 TIMES DAILY    Alcohol  Swabs  Pads, Medicated Use as directed for fingersticks. Indications: type 2 diabetes mellitus, E11.9    Blood Sugar Diagnostic (BLOOD GLUCOSE TEST) Strip 1 Strip Daily Indications: type 2 diabetes mellitus, E11.9    Blood-Glucose Meter Misc Apply 1 Device topically Daily Glucometer of choice.  Use as directed. Indications: type 2 diabetes mellitus    buPROPion  (WELLBUTRIN  XL) 300 mg extended release 24 hr tablet Take 1 Tablet (300 mg total) by mouth Daily    Clindamycin -Benzoyl Peroxide  1-5 % Gel Apply topically Twice daily Apply to affected area after the skin has been cleansed and dried.    cyanocobalamin  (VITAMIN B12) 1,000 mcg/mL Injection Solution 1cc IM once a week for 1 month then 1cc IM  once a month    fluticasone propionate (FLONASE) 50 mcg/actuation Nasal Spray, Suspension Administer 2 Sprays into each nostril Once per day as needed for Other    lancets (UNIVERSAL 1 LANCETS) 26 gauge Misc Apply 1 Device topically Daily Fingersticks 4 times daily and as needed. Indications: type 2 diabetes mellitus, E11.9    Levocetirizine (XYZAL) 5 mg Oral Tablet Take 1 Tablet (5 mg total) by mouth Daily    mirabegron  (MYRBETRIQ ) 50 mg Oral Tablet Sustained Release 24 hr Take 1 Tablet (50 mg total) by mouth Daily    montelukast  (SINGULAIR ) 10 mg Oral Tablet Take 1 Tablet (10 mg total) by mouth Every night    sertraline  (ZOLOFT ) 100 mg Oral Tablet Take 1.5 Tablets (150 mg total) by mouth Daily    Syringe with Needle, Disp, (SYRINGE 3CC/25GX1) 3 mL 25 gauge x 1 Syringe To Be used with B12 injections       Objective:     BP 132/79   Pulse 74   Temp 36.8 C (98.2 F) (Temporal)   Ht 1.575 m (5' 2)   Wt (!) 137 kg (303 lb)   SpO2 93%   BMI 55.42 kg/m       BMI addressed: BMI  55.42     Physical Exam  Nursing note reviewed.   Constitutional:  General: She is not in acute distress.     Appearance: Normal appearance. She is normal weight.   HENT:      Head: Normocephalic and atraumatic.      Right Ear: Tympanic membrane normal.      Left Ear: Tympanic membrane normal.      Nose: Nose normal.      Mouth/Throat:      Mouth: Mucous membranes are moist.      Pharynx: Oropharynx is clear.   Eyes:      Extraocular Movements: Extraocular movements intact.      Conjunctiva/sclera: Conjunctivae normal.   Neck:      Thyroid: No thyromegaly.      Vascular: No carotid bruit.   Cardiovascular:      Rate and Rhythm: Normal rate and regular rhythm.      Pulses: Normal pulses.      Heart sounds: Normal heart sounds. No murmur heard.  Pulmonary:      Effort: Pulmonary effort is normal.      Breath sounds: Normal breath sounds.   Musculoskeletal:         General: No swelling or deformity. Normal range of motion.       Cervical back: Normal range of motion. No tenderness.   Lymphadenopathy:      Cervical: No cervical adenopathy.   Skin:     General: Skin is warm and dry.   Neurological:      General: No focal deficit present.      Mental Status: She is alert. Mental status is at baseline.      Cranial Nerves: No cranial nerve deficit.      Motor: No weakness.   Psychiatric:         Mood and Affect: Mood normal.         Thought Content: Thought content normal.           Assessment & Plan  Type 2 diabetes mellitus    Orders:    BASIC METABOLIC PANEL; Future    HEPATIC FUNCTION PANEL; Future    HGA1C (HEMOGLOBIN A1C WITH EST AVG GLUCOSE); Future    LIPID PANEL; Future    Asthma  Asthma controlled no rescue inhaler   Advice vaccines-pt.declines       Morbid obesity with body mass index of 50.0-59.9 in adult (CMS [facility])  Check labs        Anxiety  continue Zoloft  will call if she decides it needs increased       Breast cancer screening by mammogram    Orders:    MAMMO BILATERAL SCREENING-ADDL VIEWS/BREAST US  AS REQ BY RAD; Future    Encounter to establish care  Refer to gynecologist-pt.requests [facility] for her pap/gyn exam  Orders:    Referral to External Provider       Other orders    Levocetirizine (XYZAL) 5 mg Oral Tablet; Take 1 Tablet (5 mg total) by mouth Daily    buPROPion  (WELLBUTRIN  XL) 300 mg extended release 24 hr tablet; Take 1 Tablet (300 mg total) by mouth Daily    Clindamycin -Benzoyl Peroxide  1-5 % Gel; Apply topically Twice daily Apply to affected area after the skin has been cleansed and dried.    cyanocobalamin  (VITAMIN B12) 1,000 mcg/mL Injection Solution; 1cc IM once a week for 1 month then 1cc IM once a month    mirabegron  (MYRBETRIQ ) 50 mg Oral Tablet Sustained Release 24 hr; Take 1 Tablet (50 mg total) by mouth Daily  montelukast  (SINGULAIR ) 10 mg Oral Tablet; Take 1 Tablet (10 mg total) by mouth Every night    sertraline  (ZOLOFT ) 100 mg Oral Tablet; Take 1.5 Tablets (150 mg total) by mouth Daily     Syringe with Needle, Disp, (SYRINGE 3CC/25GX1) 3 mL 25 gauge x 1 Syringe; To Be used with B12 injections          The patient was given the opportunity to ask questions and those questions were answered to the patient's satisfaction. The patient was encouraged to call with any additional questions or concerns.    Follow up: Return in about 2 months (around 06/06/2024), or 30 minute.    Gerardine Ammon, PA-C

## 2024-04-06 NOTE — Nursing Note (Signed)
 04/06/24 1619   Recent Weight Change   Have you had a recent unexplained weight loss or gain? N   Domestic Violence   Because we are aware of abuse and domestic violence today, we ask all patients: Are you being hurt, hit, or frightened by anyone at your home or in your life?  N   Basic Needs   Do you have any basic needs within your home that are not being met? (such as Food, Shelter, [employer], Tranportation, paying for bills and/or medications) N

## 2024-04-08 NOTE — Assessment & Plan Note (Signed)
 Asthma controlled no rescue inhaler   Advice vaccines-pt.declines

## 2024-04-08 NOTE — Assessment & Plan Note (Addendum)
 Check labs

## 2024-04-08 NOTE — Assessment & Plan Note (Signed)
 continue Zoloft  will call if she decides it needs increased

## 2024-04-26 ENCOUNTER — Ambulatory Visit (HOSPITAL_COMMUNITY): Payer: Self-pay

## 2024-05-31 ENCOUNTER — Other Ambulatory Visit (INDEPENDENT_AMBULATORY_CARE_PROVIDER_SITE_OTHER): Payer: Self-pay | Admitting: Family Medicine

## 2024-06-13 ENCOUNTER — Other Ambulatory Visit: Payer: Self-pay

## 2024-06-13 ENCOUNTER — Ambulatory Visit: Payer: Self-pay | Admitting: PHYSICIAN ASSISTANT

## 2024-06-13 ENCOUNTER — Encounter (INDEPENDENT_AMBULATORY_CARE_PROVIDER_SITE_OTHER): Payer: Self-pay | Admitting: PHYSICIAN ASSISTANT

## 2024-06-13 MED ORDER — METHOCARBAMOL 750 MG TABLET: 750.0000 mg | ORAL_TABLET | Freq: Two times a day (BID) | ORAL | 0 refills | Status: DC | PRN

## 2024-06-13 MED ORDER — BACLOFEN 10 MG TABLET: 10.0000 mg | ORAL_TABLET | Freq: Two times a day (BID) | ORAL | 0 refills | Status: AC

## 2024-06-13 MED ORDER — METHYLPREDNISOLONE 4 MG TABLETS IN A DOSE PACK: ORAL_TABLET | ORAL | 1 refills | Status: DC

## 2024-06-13 MED ORDER — IPRATROPIUM 0.5 MG-ALBUTEROL 3 MG (2.5 MG BASE)/3 ML NEBULIZATION SOLN: 3.0000 mL | INHALATION_SOLUTION | Freq: Four times a day (QID) | RESPIRATORY_TRACT | 3 refills | Status: AC

## 2024-06-13 MED ORDER — AZITHROMYCIN 250 MG TABLET: ORAL_TABLET | ORAL | 0 refills | Status: AC

## 2024-06-13 MED ORDER — BENZONATATE 200 MG CAPSULE: 200.0000 mg | ORAL_CAPSULE | Freq: Three times a day (TID) | ORAL | 0 refills | Status: AC

## 2024-06-13 MED ORDER — BUDESONIDE-FORMOTEROL HFA 160 MCG-4.5 MCG/ACTUATION AEROSOL INHALER: 2.0000 | INHALATION_SPRAY | Freq: Two times a day (BID) | RESPIRATORY_TRACT | 3 refills | Status: AC

## 2024-06-13 MED ORDER — DEXAMETHASONE SODIUM PHOSPHATE 4 MG/ML INJECTION SOLUTION
4.0000 mg | INTRAMUSCULAR | Status: AC
  Administered 2024-06-13: 4 mg via INTRAMUSCULAR

## 2024-06-13 NOTE — Nursing Note (Signed)
 06/13/24 1554   PHQ 9 (follow up)   Little interest or pleasure in doing things. 0   Feeling down, depressed, or hopeless 2   PHQ 2 Total 2   Trouble falling or staying asleep, or sleeping too much. 2   Feeling tired or having little energy 0   Poor appetite or overeating 0   Feeling bad about yourself/ that you are a failure in the past 2 weeks? 0   Trouble concentrating on things in the past 2 weeks? 0   Moving/Speaking slowly or being fidgety or restless  in the past 2 weeks? 0   Thoughts that you would be better off DEAD, or of hurting yourself in some way. 0   If you checked off any problems, how difficult have these problems made it for you to do your work, take care of things at home, or get along with other people? Not difficult at all   PHQ 9 Total 4   Interpretation of Total Score No depression

## 2024-06-13 NOTE — Nursing Note (Signed)
 The influenza vaccine was not given because the patient/caregiver declined.

## 2024-06-13 NOTE — Nursing Note (Signed)
 06/13/24 1553   Domestic Violence   Because we are aware of abuse and domestic violence today, we ask all patients: Are you being hurt, hit, or frightened by anyone at your home or in your life?  N   Basic Needs   Do you have any basic needs within your home that are not being met? (such as Food, Shelter, [employer], Tranportation, paying for bills and/or medications) N

## 2024-06-13 NOTE — Progress Notes (Unsigned)
 FAMILY MEDICINE, MEDICAL OFFICE BUILDING  [address]  [place] [address]  Operated by [facility]    Progress Note    Stephanie Blackburn  26-Feb-1966  [id number]    Date of Service: 06/13/2024  3:45 PM EST    Chief complaint:   Chief Complaint   Patient presents with    Follow Up 3 Months     Pt c/o cough she's had for [redacted] weeks along with nasal drainage. Pt unaware she was a diabetic? Has not been checking blood sugars       Subjective:     This is a case of a 58 y.o. year old female who comes in today for follow up. Pt.states she's been sick for 2 weeks and increase lately. green      Review of Systems   Constitutional:  Negative for chills, diaphoresis and fatigue.   HENT:  Positive for congestion, facial swelling, postnasal drip, rhinorrhea, sinus pressure, sinus pain and voice change. Negative for ear pain, nosebleeds, sore throat and trouble swallowing.    Respiratory:  Positive for cough, shortness of breath and wheezing. Negative for choking and chest tightness.         Green sputum for 2 weeks   Cardiovascular: Negative.    Gastrointestinal: Negative.       All systems reviewed and negative except for what is noted above.         Current Outpatient Medications   Medication Sig    albuterol  sulfate (PROVENTIL  OR VENTOLIN  OR PROAIR ) 90 mcg/actuation Inhalation oral inhaler inhale TWO PUFFS EVERY 4 HOURS AS NEEDED SHORTNESS OF BREATH]    Alcohol  Swabs  Pads, Medicated Use as directed for fingersticks. Indications: type 2 diabetes mellitus, E11.9 (Patient not taking: Reported on 06/13/2024)    aspirin 81 mg Oral Tablet, Chewable Chew 1 Tablet (81 mg total) Daily    azelastine (ASTELIN) 137 mcg (0.1 %) Nasal Spray, Non-Aerosol inhale TWO SPRAYS IN EACH NOSTRIL TWICE DAILY    benzonatate  (TESSALON ) 100 mg Oral Capsule TAKE ONE CAPSULE BY MOUTH THREE TIMES DAILY AS NEEDED    Blood Sugar Diagnostic (BLOOD GLUCOSE TEST) Strip 1 Strip Daily Indications: type 2 diabetes mellitus, E11.9 (Patient not  taking: Reported on 06/13/2024)    Blood-Glucose Meter Misc Apply 1 Device topically Daily Glucometer of choice.  Use as directed. Indications: type 2 diabetes mellitus (Patient not taking: Reported on 06/13/2024)    buPROPion  (WELLBUTRIN  XL) 300 mg extended release 24 hr tablet Take 1 Tablet (300 mg total) by mouth Daily    Clindamycin -Benzoyl Peroxide  1-5 % Gel Apply topically Twice daily Apply to affected area after the skin has been cleansed and dried.    cyanocobalamin  (VITAMIN B12) 1,000 mcg/mL Injection Solution 1cc IM once a week for 1 month then 1cc IM once a month    fluticasone propionate (FLONASE) 50 mcg/actuation Nasal Spray, Suspension Administer 2 Sprays into each nostril Once per day as needed for Other    lancets (UNIVERSAL 1 LANCETS) 26 gauge Misc Apply 1 Device topically Daily Fingersticks 4 times daily and as needed. Indications: type 2 diabetes mellitus, E11.9 (Patient not taking: Reported on 06/13/2024)    Levocetirizine (XYZAL) 5 mg Oral Tablet Take 1 Tablet (5 mg total) by mouth Daily    mirabegron  (MYRBETRIQ ) 50 mg Oral Tablet Sustained Release 24 hr Take 1 Tablet (50 mg total) by mouth Daily    montelukast  (SINGULAIR ) 10 mg Oral Tablet Take 1 Tablet (10 mg total) by mouth Every  night    sertraline  (ZOLOFT ) 100 mg Oral Tablet Take 1.5 Tablets (150 mg total) by mouth Daily    Syringe with Needle, Disp, (SYRINGE 3CC/25GX1) 3 mL 25 gauge x 1 Syringe To Be used with B12 injections (Patient not taking: Reported on 06/13/2024)       Objective:     BP (!) 148/76   Pulse 85   Temp 36.4 C (97.6 F) (Temporal)   Ht 1.575 m (5' 2)   Wt (!) 141 kg (311 lb 3.2 oz)   SpO2 96%   BMI 56.92 kg/m       BMI addressed: {BMI Addressed:24238}     Physical Exam      Assessment & Plan       Other orders    dexAMETHasone  4 mg/mL injection    azithromycin  (ZITHROMAX ) 250 mg Oral Tablet; Take 500 mg (2 tab) on day 1; take 250 mg (1 tab) on days 2-5.    Benzonatate  (TESSALON ) 200 mg Oral Capsule; Take 1 Capsule  (200 mg total) by mouth Three times a day    ipratropium-albuterol  0.5 mg-3 mg(2.5 mg base)/3 mL Solution for Nebulization; Take 3 mL by nebulization Four times a day    budesonide -formoteroL  (SYMBICORT ) 160-4.5 mcg/actuation Inhalation oral inhaler; Take 2 Puffs by inhalation Twice daily    methocarbamoL  (ROBAXIN ) 750 mg Oral Tablet; Take 1 Tablet (750 mg total) by mouth Twice per day as needed    Methylprednisolone  (MEDROL , PAK,) 4 mg Oral Tablets, Dose Pack; Take as instructed.          The patient was given the opportunity to ask questions and those questions were answered to the patient's satisfaction. The patient was encouraged to call with any additional questions or concerns.    Follow up: No follow-ups on file.    Gerardine Ammon, PA-C

## 2024-07-02 ENCOUNTER — Ambulatory Visit: Payer: Self-pay | Attending: PHYSICIAN ASSISTANT | Admitting: PHYSICIAN ASSISTANT

## 2024-07-02 ENCOUNTER — Other Ambulatory Visit: Payer: Self-pay

## 2024-07-02 ENCOUNTER — Encounter (INDEPENDENT_AMBULATORY_CARE_PROVIDER_SITE_OTHER): Payer: Self-pay | Admitting: PHYSICIAN ASSISTANT

## 2024-07-02 VITALS — BP 145/57 | HR 97 | Temp 97.6°F | Ht 62.0 in | Wt 311.0 lb

## 2024-07-02 DIAGNOSIS — H6990 Unspecified Eustachian tube disorder, unspecified ear: Secondary | ICD-10-CM | POA: Insufficient documentation

## 2024-07-02 DIAGNOSIS — R059 Cough, unspecified: Secondary | ICD-10-CM | POA: Insufficient documentation

## 2024-07-02 DIAGNOSIS — R52 Pain, unspecified: Secondary | ICD-10-CM | POA: Insufficient documentation

## 2024-07-02 DIAGNOSIS — H8309 Labyrinthitis, unspecified ear: Secondary | ICD-10-CM | POA: Insufficient documentation

## 2024-07-02 LAB — POCT RAPID COVID-19 & FLU (AMB ONLY)
COVID-19 AG: NEGATIVE
INFLUENZA TYPE A: NEGATIVE
INFLUENZA TYPE B: NEGATIVE

## 2024-07-02 MED ORDER — DEXAMETHASONE SODIUM PHOSPHATE 4 MG/ML INJECTION SOLUTION
4.0000 mg | INTRAMUSCULAR | Status: AC
  Administered 2024-07-02: 4 mg via INTRAMUSCULAR

## 2024-07-02 MED ORDER — PREDNISONE 10 MG TABLET: 10.0000 mg | ORAL_TABLET | Freq: Once | ORAL | 0 refills | Status: AC | PRN

## 2024-07-02 MED ORDER — CEFUROXIME AXETIL 500 MG TABLET: 500.0000 mg | ORAL_TABLET | Freq: Two times a day (BID) | ORAL | 0 refills | Status: AC

## 2024-07-02 MED ORDER — ONDANSETRON HCL 8 MG TABLET: 8.0000 mg | ORAL_TABLET | Freq: Three times a day (TID) | ORAL | 0 refills | Status: AC | PRN

## 2024-07-02 NOTE — Progress Notes (Signed)
 FAMILY MEDICINE, MEDICAL OFFICE BUILDING  [address]  [place] [address]  Operated by [facility]    Progress Note    Stephanie Blackburn  Sep 23, 1965  [id number]    Date of Service: 07/02/2024  9:00 AM EST    Chief complaint:   Chief Complaint   Patient presents with    Follow Up 2 Weeks     Pt is here to f/u on congestion. Pt says it has gone away. Pt got vertigo on Friday along with throwing up.       Subjective:     This is a case of a 58 y.o. year old female who comes in today for follow up. Pt.states she was feeling better until Friday evening and then became worse with new dizziness and position changes cause the dizziness. Nausea, no D,C.headache ear pressure, and popping. No fever or chills, bad body aches last pm. Breathing is better. Eat and drink ok. No vision changes.     Review of Systems   All systems reviewed and negative except for what is noted above.       Current Outpatient Medications   Medication Sig    albuterol  sulfate (PROVENTIL  OR VENTOLIN  OR PROAIR ) 90 mcg/actuation Inhalation oral inhaler inhale TWO PUFFS EVERY 4 HOURS AS NEEDED SHORTNESS OF BREATH]    Alcohol  Swabs  Pads, Medicated Use as directed for fingersticks. Indications: type 2 diabetes mellitus, E11.9    aspirin 81 mg Oral Tablet, Chewable Chew 1 Tablet (81 mg total) Daily    azelastine (ASTELIN) 137 mcg (0.1 %) Nasal Spray, Non-Aerosol inhale TWO SPRAYS IN EACH NOSTRIL TWICE DAILY    azithromycin  (ZITHROMAX ) 250 mg Oral Tablet Take 500 mg (2 tab) on day 1; take 250 mg (1 tab) on days 2-5.    baclofen  (LIORESAL ) 10 mg Oral Tablet Take 1 Tablet (10 mg total) by mouth Twice daily    benzonatate  (TESSALON ) 100 mg Oral Capsule TAKE ONE CAPSULE BY MOUTH THREE TIMES DAILY AS NEEDED    Benzonatate  (TESSALON ) 200 mg Oral Capsule Take 1 Capsule (200 mg total) by mouth Three times a day    Blood Sugar Diagnostic (BLOOD GLUCOSE TEST) Strip 1 Strip Daily Indications: type 2 diabetes mellitus, E11.9    Blood-Glucose Meter  Misc Apply 1 Device topically Daily Glucometer of choice.  Use as directed. Indications: type 2 diabetes mellitus    budesonide -formoteroL  (SYMBICORT ) 160-4.5 mcg/actuation Inhalation oral inhaler Take 2 Puffs by inhalation Twice daily    buPROPion  (WELLBUTRIN  XL) 300 mg extended release 24 hr tablet Take 1 Tablet (300 mg total) by mouth Daily    cefuroxime  (CEFTIN ) 500 mg Oral Tablet Take 1 Tablet (500 mg total) by mouth Twice daily for 7 days    Clindamycin -Benzoyl Peroxide  1-5 % Gel Apply topically Twice daily Apply to affected area after the skin has been cleansed and dried.    cyanocobalamin  (VITAMIN B12) 1,000 mcg/mL Injection Solution 1cc IM once a week for 1 month then 1cc IM once a month    fluticasone propionate (FLONASE) 50 mcg/actuation Nasal Spray, Suspension Administer 2 Sprays into each nostril Once per day as needed for Other    ipratropium-albuterol  0.5 mg-3 mg(2.5 mg base)/3 mL Solution for Nebulization Take 3 mL by nebulization Four times a day    lancets (UNIVERSAL 1 LANCETS) 26 gauge Misc Apply 1 Device topically Daily Fingersticks 4 times daily and as needed. Indications: type 2 diabetes mellitus, E11.9    Levocetirizine (XYZAL) 5 mg Oral Tablet  Take 1 Tablet (5 mg total) by mouth Daily    mirabegron  (MYRBETRIQ ) 50 mg Oral Tablet Sustained Release 24 hr Take 1 Tablet (50 mg total) by mouth Daily    montelukast  (SINGULAIR ) 10 mg Oral Tablet Take 1 Tablet (10 mg total) by mouth Every night    ondansetron  (ZOFRAN ) 8 mg Oral Tablet Take 1 Tablet (8 mg total) by mouth Every 8 hours as needed for Nausea/Vomiting    predniSONE  (DELTASONE ) 10 mg Oral Tablet Take 1 Tablet (10 mg total) by mouth Once, as needed for up to 1 dose    sertraline  (ZOLOFT ) 100 mg Oral Tablet Take 1.5 Tablets (150 mg total) by mouth Daily    Syringe with Needle, Disp, (SYRINGE 3CC/25GX1) 3 mL 25 gauge x 1 Syringe To Be used with B12 injections       Objective:     BP (!) 145/57   Pulse 97   Temp 36.4 C (97.6 F) (Temporal)    Ht 1.575 m (5' 2)   Wt (!) 141 kg (311 lb)   SpO2 93%   BMI 56.88 kg/m     Physical Exam  Vitals and nursing note reviewed.   Constitutional:       General: She is not in acute distress.     Appearance: Normal appearance. She is normal weight.   HENT:      Head: Normocephalic and atraumatic.      Right Ear: Tympanic membrane normal.      Left Ear: Tympanic membrane normal.      Nose: Nose normal.      Mouth/Throat:      Mouth: Mucous membranes are moist.      Pharynx: Oropharynx is clear.   Eyes:      Extraocular Movements: Extraocular movements intact.      Conjunctiva/sclera: Conjunctivae normal.   Neck:      Thyroid: No thyromegaly.      Vascular: No carotid bruit.   Cardiovascular:      Rate and Rhythm: Normal rate and regular rhythm.      Pulses: Normal pulses.      Heart sounds: Normal heart sounds. No murmur heard.  Pulmonary:      Effort: Pulmonary effort is normal.      Breath sounds: Normal breath sounds.   Abdominal:      General: Abdomen is flat. Bowel sounds are normal.      Palpations: Abdomen is soft.   Musculoskeletal:         General: No swelling or deformity. Normal range of motion.      Cervical back: Normal range of motion. No tenderness.   Lymphadenopathy:      Cervical: No cervical adenopathy.   Skin:     General: Skin is warm and dry.   Neurological:      General: No focal deficit present.      Mental Status: She is alert. Mental status is at baseline.      Cranial Nerves: No cranial nerve deficit.      Motor: No weakness.   Psychiatric:         Mood and Affect: Mood normal.         Thought Content: Thought content normal.       Assessment & Plan  Cough    Orders:    POCT Rapid Covid & Flu ([place])    Labyrinthitis, unspecified laterality         Eustachian tube disorder  Generalized body aches            Other orders    ondansetron  (ZOFRAN ) 8 mg Oral Tablet; Take 1 Tablet (8 mg total) by mouth Every 8 hours as needed for Nausea/Vomiting    dexAMETHasone  4 mg/mL injection     cefuroxime  (CEFTIN ) 500 mg Oral Tablet; Take 1 Tablet (500 mg total) by mouth Twice daily for 7 days    predniSONE  (DELTASONE ) 10 mg Oral Tablet; Take 1 Tablet (10 mg total) by mouth Once, as needed for up to 1 dose          The patient was given the opportunity to ask questions and those questions were answered to the patient's satisfaction. The patient was encouraged to call with any additional questions or concerns.    Follow up: Return in about 2 months (around 09/02/2024), or 30 minutes.    Gerardine Ammon, PA-C

## 2024-07-02 NOTE — Nursing Note (Signed)
 The influenza vaccine was not given because the patient/caregiver declined.

## 2024-07-02 NOTE — Nursing Note (Signed)
 07/02/24 0919   Health Education and Literacy   How often do you have a problem understanding what is told to you about your medical condition?  Never   Domestic Violence   Because we are aware of abuse and domestic violence today, we ask all patients: Are you being hurt, hit, or frightened by anyone at your home or in your life?  N   Basic Needs   Do you have any basic needs within your home that are not being met? (such as Food, Shelter, [employer], Tranportation, paying for bills and/or medications) N   Advanced Directives   Do you have any advanced directives? Living Will & MPOA   Do you have the Advanced Directive(s) so we can scan them to your chart? N   Would you like an advanced directive packet? Refused Packet

## 2024-07-27 ENCOUNTER — Other Ambulatory Visit (INDEPENDENT_AMBULATORY_CARE_PROVIDER_SITE_OTHER): Payer: Self-pay | Admitting: Family Medicine

## 2024-08-01 ENCOUNTER — Other Ambulatory Visit (INDEPENDENT_AMBULATORY_CARE_PROVIDER_SITE_OTHER): Payer: Self-pay | Admitting: PHYSICIAN ASSISTANT

## 2024-09-03 ENCOUNTER — Ambulatory Visit (INDEPENDENT_AMBULATORY_CARE_PROVIDER_SITE_OTHER): Payer: Self-pay | Admitting: PHYSICIAN ASSISTANT
